# Patient Record
Sex: Male | Born: 1965 | Race: Black or African American | Hispanic: No | Marital: Single | Smoking: Current every day smoker
Health system: Southern US, Community
[De-identification: ages and names within clinical notes are randomized; demographics above are authoritative.]

## PROBLEM LIST (undated history)

## (undated) ENCOUNTER — Emergency Department (HOSPITAL_COMMUNITY): Discharge status: Against Medical Advice | Payer: Medicaid - Out of State

## (undated) DIAGNOSIS — M25569 Pain in unspecified knee: Secondary | ICD-10-CM

## (undated) HISTORY — PX: HIP SURGERY: SHX245

## (undated) HISTORY — PX: FRACTURE SURGERY: SHX138

---

## 1999-09-03 ENCOUNTER — Emergency Department (HOSPITAL_COMMUNITY): Admission: EM | Admit: 1999-09-03 | Discharge: 1999-09-03 | Payer: Self-pay

## 1999-09-03 ENCOUNTER — Encounter: Payer: Self-pay | Admitting: Emergency Medicine

## 1999-11-13 ENCOUNTER — Emergency Department (HOSPITAL_COMMUNITY): Admission: EM | Admit: 1999-11-13 | Discharge: 1999-11-13 | Payer: Self-pay

## 2000-03-26 ENCOUNTER — Emergency Department (HOSPITAL_COMMUNITY): Admission: EM | Admit: 2000-03-26 | Discharge: 2000-03-26 | Payer: Self-pay

## 2006-08-07 ENCOUNTER — Emergency Department (HOSPITAL_COMMUNITY): Admission: EM | Admit: 2006-08-07 | Discharge: 2006-08-07 | Payer: Self-pay | Admitting: Emergency Medicine

## 2006-11-30 ENCOUNTER — Emergency Department (HOSPITAL_COMMUNITY): Admission: EM | Admit: 2006-11-30 | Discharge: 2006-11-30 | Payer: Self-pay | Admitting: Emergency Medicine

## 2013-04-08 ENCOUNTER — Emergency Department (HOSPITAL_COMMUNITY): Payer: Medicaid - Out of State

## 2013-04-08 ENCOUNTER — Encounter (HOSPITAL_COMMUNITY): Payer: Self-pay | Admitting: Emergency Medicine

## 2013-04-08 ENCOUNTER — Emergency Department (HOSPITAL_COMMUNITY)
Admission: EM | Admit: 2013-04-08 | Discharge: 2013-04-08 | Disposition: A | Discharge status: Home / Self Care | Payer: Medicaid - Out of State | Attending: Emergency Medicine | Admitting: Emergency Medicine

## 2013-04-08 DIAGNOSIS — M25531 Pain in right wrist: Secondary | ICD-10-CM

## 2013-04-08 DIAGNOSIS — F17200 Nicotine dependence, unspecified, uncomplicated: Secondary | ICD-10-CM | POA: Insufficient documentation

## 2013-04-08 DIAGNOSIS — M25539 Pain in unspecified wrist: Secondary | ICD-10-CM | POA: Insufficient documentation

## 2013-04-08 MED ORDER — OXYCODONE-ACETAMINOPHEN 5-325 MG PO TABS
2.0000 | ORAL_TABLET | Freq: Once | ORAL | Status: AC
  Administered 2013-04-08: 2 via ORAL
  Filled 2013-04-08: qty 2

## 2013-04-08 MED ORDER — OXYCODONE-ACETAMINOPHEN 5-325 MG PO TABS: 1.0000 | ORAL_TABLET | Freq: Four times a day (QID) | ORAL | Status: DC | PRN

## 2013-04-08 NOTE — ED Notes (Signed)
Pt states that he has been doing yardwork and felt rt wrist pain x 2 days; pt denies injury but states that the pain has progressed and feels unable to lift up leaves or anything due to pain in wrist; Rt wrist mildly swollen and tender to palpation

## 2013-04-08 NOTE — ED Provider Notes (Signed)
CSN: [id number]     Arrival date & time 04/08/13  2119 History  This chart was scribed for non-physician practitioner Junius Finner, PA-C working with Richardean Canal, MD by Caryn Bee, ED Scribe. This patient was seen in room WTR8/WTR8 and the patient's care was started at 10:34 PM.    Chief Complaint  Patient presents with  . Wrist Pain   HPI HPI Comments: Joseph Rubio is a 47 y.o. male who presents to the Emergency Department complaining of constant gradual worsening right wrist pain that began 2 weeks ago, although triage note reports worsening 2 days ago. He denies falling or recent injury to the right wrist but does report using more helping family members around the house and yard. Pain is worsened with movement and when he tries to lift things. He has taken aleve, 800 mg ibuprofen, and hydrocodone with relief. Pt has also been wearing a brace intermittently on his wrist that he obtained from a family member for about one week with no relief. He denies numbness. Pt is right handed. Pt has h/o hip surgery after a fall where he also injured his hand but is a poor historian about how severely he injured same hand during the fall. He saw an orthopaedist in [place] regarding his hand and was told that there was no injury but then stated his hand did not hurt at that time. Pt works in Aeronautical [employer]. Pt does not have PCP.  No hx of gout.   History reviewed. No pertinent past medical history. Past Surgical History  Procedure Laterality Date  . Hip surgery     No family history on file. History  Substance Use Topics  . Smoking status: Current Every Day Smoker -- 0.25 packs/day    Types: Cigarettes  . Smokeless tobacco: Not on file  . Alcohol Use: Yes     Comment: socially    Review of Systems  Musculoskeletal: Arthralgias: Right wrist    Neurological: Negative for numbness.  All other systems reviewed and are negative.    Allergies  Review of patient's allergies indicates no  known allergies.  Home Medications   Current Outpatient Rx  Name  Route  Sig  Dispense  Refill  . ibuprofen (ADVIL,MOTRIN) 100 MG tablet   Oral   Take 800 mg by mouth every 6 (six) hours as needed for fever (pain).         . naproxen (NAPROSYN) 250 MG tablet   Oral   Take 500 mg by mouth 2 (two) times daily as needed (pain).         Marland Kitchen oxyCODONE-acetaminophen (PERCOCET/ROXICET) 5-325 MG per tablet   Oral   Take 1 tablet by mouth every 6 (six) hours as needed for pain.   6 tablet   0    Triage Vitals: BP 145/97  Pulse 95  Temp(Src) 98.3 F (36.8 C) (Oral)  Resp 18  Ht 5\' 7"  (1.702 m)  Wt 165 lb (74.844 kg)  BMI 25.84 kg/m2  SpO2 98%  Physical Exam  Nursing note and vitals reviewed. Constitutional: He is oriented to person, place, and time. He appears well-developed and well-nourished.  HENT:  Head: Normocephalic and atraumatic.  Eyes: EOM are normal.  Neck: Normal range of motion.  Cardiovascular: Normal rate.   Pulmonary/Chest: Effort normal.  Musculoskeletal: He exhibits edema.  Right wrist mild: swelling and stiffness. Limited flexion and extension at the wrist. Tenderness on volar aspect of the wrist. Tenderness along ulnar aspect. No ecchymosis, erythema,  or warmth. 4/5 grip strength. Normal sensation to light touch. Radial pulse 2+.   Neurological: He is alert and oriented to person, place, and time.  Skin: Skin is warm and dry.  Psychiatric: He has a normal mood and affect. His behavior is normal.    ED Course  Procedures (including critical care time) DIAGNOSTIC STUDIES: Oxygen Saturation is 98% on room air, normal by my interpretation.    COORDINATION OF CARE: 10:45 PM-Discussed treatment plan with pt at bedside and pt agreed to plan.   Labs Review Labs Reviewed - No data to display Imaging Review Dg Wrist Complete Right  04/08/2013   CLINICAL DATA:  Pain without trauma I  EXAM: RIGHT WRIST - COMPLETE 3+ VIEW  COMPARISON:  08/07/2006  FINDINGS:  Mild narrowing of the joint space in the radiocarpal compartment. Carpal rows intact. Normal mineralization and alignment. No significant osseous degenerative change. Negative for fracture, dislocation, or other acute bone abnormality.  IMPRESSION: 1. Negative for fracture or other acute bony injury. 2. Early narrowing of the joint space in the radiocarpal compartment.   Electronically Signed   By: Oley Balm M.D.   On: 04/08/2013 23:06    EKG Interpretation   None       MDM   1. Right wrist pain    Pt presenting with right wrist stiffness and pain.  No hx of recent trauma.  Pt does report increased use of hand helping family members around house and yard.  Plain films: negative for fx of acute bony injury, early narrowing of joint space in radiocarpal compartment (possible source of pt's symptoms?)   On exam: mild swelling, no erythema, ecchymosis or warmth. Not concerned for gout or cellulitis.  Will tx as an over use injury.  Advised pt to f/u with Dr. Mina Marble, hand surgery, for continued pain and stiffness. Rx: cock-up wrist splint. Percocet (6 tabs). Also encouraged use of heat and gentle movements throughout the day to help prevent wrist from getting too stiff. Splint should be worn at night and while working in the yard.  Pt verbalized understanding and agreement with tx plan.  I personally performed the services described in this documentation, which was scribed in my presence. The recorded information has been reviewed and is accurate.    Junius Finner, PA-C 04/09/13 [phone]

## 2013-04-09 NOTE — ED Provider Notes (Signed)
Medical screening examination/treatment/procedure(s) were performed by non-physician practitioner and as supervising physician I was immediately available for consultation/collaboration.  EKG Interpretation   None        Atha Mcbain K Sem Mccaughey-Rasch, MD 04/09/13 0313 

## 2014-09-24 ENCOUNTER — Encounter (HOSPITAL_COMMUNITY): Payer: Self-pay | Admitting: *Deleted

## 2014-09-24 ENCOUNTER — Emergency Department (HOSPITAL_COMMUNITY): Payer: Medicaid - Out of State

## 2014-09-24 ENCOUNTER — Emergency Department (HOSPITAL_COMMUNITY)
Admission: EM | Admit: 2014-09-24 | Discharge: 2014-09-24 | Disposition: A | Discharge status: Home / Self Care | Payer: Medicaid - Out of State | Attending: Emergency Medicine | Admitting: Emergency Medicine

## 2014-09-24 DIAGNOSIS — R1032 Left lower quadrant pain: Secondary | ICD-10-CM

## 2014-09-24 DIAGNOSIS — R1030 Lower abdominal pain, unspecified: Secondary | ICD-10-CM | POA: Insufficient documentation

## 2014-09-24 DIAGNOSIS — Z9889 Other specified postprocedural states: Secondary | ICD-10-CM | POA: Insufficient documentation

## 2014-09-24 DIAGNOSIS — M791 Myalgia: Secondary | ICD-10-CM | POA: Insufficient documentation

## 2014-09-24 DIAGNOSIS — Z79899 Other long term (current) drug therapy: Secondary | ICD-10-CM | POA: Insufficient documentation

## 2014-09-24 DIAGNOSIS — M7989 Other specified soft tissue disorders: Secondary | ICD-10-CM | POA: Insufficient documentation

## 2014-09-24 DIAGNOSIS — Z8781 Personal history of (healed) traumatic fracture: Secondary | ICD-10-CM | POA: Insufficient documentation

## 2014-09-24 DIAGNOSIS — Z720 Tobacco use: Secondary | ICD-10-CM | POA: Insufficient documentation

## 2014-09-24 DIAGNOSIS — M25552 Pain in left hip: Secondary | ICD-10-CM | POA: Insufficient documentation

## 2014-09-24 LAB — D-DIMER, QUANTITATIVE: D-Dimer, Quant: <0.27 ug/mL-FEU (ref 0.00–0.48)

## 2014-09-24 MED ORDER — IBUPROFEN 600 MG PO TABS: 600.0000 mg | ORAL_TABLET | Freq: Four times a day (QID) | ORAL | Status: DC | PRN

## 2014-09-24 MED ORDER — OXYCODONE-ACETAMINOPHEN 5-325 MG PO TABS: 1.0000 | ORAL_TABLET | Freq: Four times a day (QID) | ORAL | Status: DC | PRN

## 2014-09-24 NOTE — ED Notes (Signed)
Pt reports left groin pain described as sharp - radiates down left anterior thigh, worse w/ ambulation x2 weeks. Pt denies any n/v/d, fever, or testicular pain or swelling.

## 2014-09-24 NOTE — ED Notes (Signed)
Pt reports L upper anterior thigh pain which is intermittent x 1 week.  Pt reports pain is worse with movement.  Unable to sleep tonight d/t pain.  Pt denies any injury at this time.

## 2014-09-24 NOTE — Discharge Instructions (Signed)
Groin Strain °A groin strain (also called a groin pull) is an injury to the muscles or tendon on the upper inner part of the thigh. These muscles are called the adductor muscles or groin muscles. They are responsible for moving the leg across the body. A muscle strain occurs when a muscle is overstretched and some muscle fibers are torn. A groin strain can range from mild to severe depending on how many muscle fibers are affected and whether the muscle fibers are partially or completely torn.  °Groin strains usually occur during exercise or participation in sports. The injury often happens when a sudden, violent force is placed on a muscle, stretching the muscle too far. A strain is more likely to occur when your muscles are not warmed up or if you are not properly conditioned. Depending on the severity of the groin strain, recovery time may vary from a few weeks to several weeks. Severe injuries often require 4-6 weeks for recovery. In these cases, complete healing can take 4-5 months.  °CAUSES  °· Stretching the groin muscles too far or too suddenly, often during side-to-side motion with an abrupt change in direction. °· Putting repeated stress on the groin muscles over a long period of time. °· Performing vigorous activity without properly stretching the groin muscles beforehand. °SYMPTOMS  °· Pain and tenderness in the groin area. This begins as sharp pain and persists as a dull ache. °· Popping or snapping feeling when the injury occurs (for severe strains). °· Swelling or bruising. °· Muscle spasms. °· Weakness in the leg. °· Stiffness in the groin area with decreased ability to move the affected muscles. °DIAGNOSIS  °Your caregiver will perform a physical exam to diagnose a groin strain. You will be asked about your symptoms and how the injury occurred. X-rays are sometimes needed to rule out a broken bone or cartilage problems. Your caregiver may order a CT scan or MRI if a complete muscle tear is  suspected. °TREATMENT  °A groin strain will often heal on its own. Your caregiver may prescribe medicines to help manage pain and swelling (anti-inflammatory medicine). You may be told to use crutches for the first few days to minimize your pain. °HOME CARE INSTRUCTIONS  °· Rest. Do not use the strained muscle if it causes pain. °· Put ice on the injured area. °¨ Put ice in a plastic bag. °¨ Place a towel between your skin and the bag. °¨ Leave the ice on for 15-20 minutes, every 2-3 hours. Do this for the first 2 days after the injury.  °· Only take over-the-counter or prescription medicines as directed by your caregiver. °· Wrap the injured area with an elastic bandage as directed by your caregiver. °· Keep the injured leg raised (elevated). °· Walk, stretch, and perform range-of-motion exercises to improve blood flow to the injured area. Only perform these activities if you can do so without any pain. °To prevent muscle strains: °· Warm up before exercise. °· Develop proper conditioning and strength in the groin muscles. °SEEK IMMEDIATE MEDICAL CARE IF:  °· You have increased pain or swelling in the affected area.   °· Your symptoms are not improving or are getting worse. °MAKE SURE YOU:  °· Understand these instructions. °· Will watch your condition. °· Will get help right away if you are not doing well or get worse. °Document Released: 01/24/2004 Document Revised: 05/14/2012 Document Reviewed: 01/30/2012 °[employer]® Patient Information ©2015 [employer]. This information is not intended to replace advice given to you   by your health care provider. Make sure you discuss any questions you have with your health care provider. ° °

## 2014-09-24 NOTE — ED Notes (Signed)
Pt ambulating independently w/ steady gait on d/c in no acute distress, A&Ox4. D/c instructions reviewed w/ pt and family - pt and family deny any further questions or concerns at present. Rx given x2  

## 2014-09-24 NOTE — ED Provider Notes (Signed)
CSN: [id number]     Arrival date & time 09/24/14  0006 History   First MD Initiated Contact with Patient 09/24/14 0023     Chief Complaint  Patient presents with  . Leg Pain    (Consider location/radiation/quality/duration/timing/severity/associated sxs/prior Treatment) HPI Comments: Patient is a 49 year old male with a history of right hip fracture surgery who presents to the emergency department for further evaluation of left groin pain. Patient states that his pain began a few weeks ago and has been worsening and constant. He describes the pain as sharp, aggravated with walking. He states that he has had a burning sensation down his left lateral leg and that the pain intermittently travels to his foot. He has noticed some mild left foot swelling. He took Aleve for symptoms without relief. Patient denies associated fever, nausea, vomiting, abdominal pain, scrotal swelling, testicular tenderness, or bowel/bladder incontinence. He has had some mild low back pain, but cannot specify if this is associated with the pain in his left leg. No recent surgeries, hospitalizations, trauma, or injury.  The history is provided by the patient. No language interpreter was used.    History reviewed. No pertinent past medical history. Past Surgical History  Procedure Laterality Date  . Hip surgery    . Fracture surgery      R hip   No family history on file. History  Substance Use Topics  . Smoking status: Current Every Day Smoker -- 0.25 packs/day    Types: Cigarettes  . Smokeless tobacco: Not on file  . Alcohol Use: Yes     Comment: socially    Review of Systems  Constitutional: Negative for fever.  Respiratory: Negative for shortness of breath.   Cardiovascular: Positive for leg swelling.  Genitourinary:       Negative for incontinence  Musculoskeletal: Positive for myalgias and back pain.  Neurological: Negative for syncope and weakness.  All other systems reviewed and are  negative.   Allergies  Review of patient's allergies indicates no known allergies.  Home Medications   Prior to Admission medications   Medication Sig Start Date End Date Taking? Authorizing Provider  naproxen (NAPROSYN) 250 MG tablet Take 500 mg by mouth 2 (two) times daily as needed (pain).   Yes Historical Provider, MD  omega-3 acid ethyl esters (LOVAZA) 1 G capsule Take 1 g by mouth 2 (two) times a week.   Yes Historical Provider, MD  oxyCODONE-acetaminophen (PERCOCET/ROXICET) 5-325 MG per tablet Take 1 tablet by mouth every 6 (six) hours as needed for pain. Patient not taking: Reported on 09/24/2014 04/08/13   Junius Finner, PA-C   BP 179/103 mmHg  Temp(Src) 97.6 F (36.4 C) (Oral)  Resp 18  Ht  (1.702 m)  Wt 163 lb (73.936 kg)  BMI 25.52 kg/m2  SpO2 99%   Physical Exam  Constitutional: He is oriented to person, place, and time. He appears well-developed and well-nourished. No distress.  Nontoxic/nonseptic appearing  HENT:  Head: Normocephalic and atraumatic.  Eyes: Conjunctivae and EOM are normal. No scleral icterus.  Neck: Normal range of motion.  Cardiovascular: Normal rate, regular rhythm and intact distal pulses.   Pulmonary/Chest: Effort normal. No respiratory distress.  Respirations even and unlabored  Abdominal: Hernia confirmed negative in the right inguinal area and confirmed negative in the left inguinal area.  Genitourinary: Testes normal and penis normal. Right testis shows no mass, no swelling and no tenderness. Right testis is descended. Left testis shows no mass, no swelling and no tenderness. Left  testis is descended. Circumcised. No penile tenderness.  Unremarkable GU exam, chaperoned. No evidence of direct or indirect inguinal hernia.  Musculoskeletal: Normal range of motion. He exhibits tenderness. He exhibits no edema.       Left hip: He exhibits tenderness. He exhibits normal range of motion, normal strength, no bony tenderness, no swelling, no  crepitus and no deformity.       Legs: No pitting edema in bilateral lower extremities  Neurological: He is alert and oriented to person, place, and time. He exhibits normal muscle tone. Coordination normal.  Sensation to light touch intact in all extremities. Patient ambulatory with steady gait. GCS 15.  Skin: Skin is warm and dry. No rash noted. He is not diaphoretic. No erythema. No pallor.  Psychiatric: He has a normal mood and affect. His behavior is normal.  Nursing note and vitals reviewed.   ED Course  Procedures (including critical care time) Labs Review Labs Reviewed  D-DIMER, QUANTITATIVE    Imaging Review Dg Lumbar Spine Complete  09/24/2014   CLINICAL DATA:  Left groin pain radiating down left thigh. Worse with ambulation. Symptoms for 2 weeks. No known injury.  EXAM: LUMBAR SPINE - COMPLETE 4+ VIEW  COMPARISON:  None.  FINDINGS: The alignment is maintained. Vertebral body heights are normal. There is no listhesis. The posterior elements are intact. Minimal diffuse endplate spurring, the disc spaces are preserved. No fracture. Sacroiliac joints are symmetric with mild degenerative change.  IMPRESSION: Mild spondylosis, no acute bony abnormality.   Electronically Signed   By: Rubye OaksMelanie  Ehinger M.D.   On: 09/24/2014 02:45   Dg Hip Unilat With Pelvis 2-3 Views Left  09/24/2014   CLINICAL DATA:  Left groin pain which is sharp and radiating  EXAM: LEFT HIP (WITH PELVIS) 2-3 VIEWS  COMPARISON:  None.  FINDINGS: Minimal spurring about the left hip with os acetabulum. There is no clear joint narrowing. No fracture, erosion, or evidence of osteonecrosis.  Remote proximal right femur fracture with ORIF. No acute findings to the visible hardware.  The bony pelvis is intact.  IMPRESSION: No acute finding or significant left hip degeneration.   Electronically Signed   By: Marnee SpringJonathon  Watts M.D.   On: 09/24/2014 02:45     EKG Interpretation None      MDM   Final diagnoses:  Left groin  pain    49 year old male presents to the emergency department for further evaluation of left groin pain. Symptoms have been ongoing over the past week. Patient is neurovascularly intact. He is afebrile and hemodynamically stable. D-dimer negative and patient with no evidence of lower extremity edema. Doubt DVT. X-ray of low back and hip are also unremarkable. No evidence of acute fracture or bony deformity. Chaperoned genital exam negative for evidence of inguinal hernia. No red flags or signs concerning for cauda equina.  Suspect that symptoms are secondary to an uncomplicated groin strain. Will manage symptoms as outpatient with Percocet and ibuprofen. Have advised primary care follow-up for further evaluation of symptoms and provided return precautions. Patient ambulated out of the ED in good condition. No unaddressed concerns expressed prior to discharge.   Antony MaduraKelly Braydn Carneiro, PA-C 09/27/14 [id number]  Marisa Severinlga Otter, MD 09/28/14 [phone]

## 2015-03-02 ENCOUNTER — Emergency Department (HOSPITAL_COMMUNITY): Payer: Medicaid - Out of State

## 2015-03-02 ENCOUNTER — Emergency Department (HOSPITAL_COMMUNITY)
Admission: EM | Admit: 2015-03-02 | Discharge: 2015-03-03 | Disposition: A | Discharge status: Home / Self Care | Payer: Medicaid - Out of State | Attending: Emergency Medicine | Admitting: Emergency Medicine

## 2015-03-02 ENCOUNTER — Encounter (HOSPITAL_COMMUNITY): Payer: Self-pay | Admitting: Emergency Medicine

## 2015-03-02 DIAGNOSIS — K029 Dental caries, unspecified: Secondary | ICD-10-CM | POA: Insufficient documentation

## 2015-03-02 DIAGNOSIS — L03211 Cellulitis of face: Secondary | ICD-10-CM

## 2015-03-02 DIAGNOSIS — Z720 Tobacco use: Secondary | ICD-10-CM | POA: Insufficient documentation

## 2015-03-02 DIAGNOSIS — K002 Abnormalities of size and form of teeth: Secondary | ICD-10-CM | POA: Insufficient documentation

## 2015-03-02 MED ORDER — IOHEXOL 300 MG/ML  SOLN
75.0000 mL | Freq: Once | INTRAMUSCULAR | Status: AC | PRN
  Administered 2015-03-02: 75 mL via INTRAVENOUS

## 2015-03-02 MED ORDER — KETOROLAC TROMETHAMINE 30 MG/ML IJ SOLN
30.0000 mg | Freq: Once | INTRAMUSCULAR | Status: AC
  Administered 2015-03-02: 30 mg via INTRAVENOUS
  Filled 2015-03-02: qty 1
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
## 2015-03-03 MED ORDER — NAPROXEN 500 MG PO TABS: 500.0000 mg | ORAL_TABLET | Freq: Two times a day (BID) | ORAL | Status: DC

## 2015-03-03 MED ORDER — CLINDAMYCIN HCL 150 MG PO CAPS: 450.0000 mg | ORAL_CAPSULE | Freq: Three times a day (TID) | ORAL | Status: DC

## 2015-03-03 MED ORDER — CLINDAMYCIN HCL 300 MG PO CAPS
450.0000 mg | ORAL_CAPSULE | Freq: Three times a day (TID) | ORAL | Status: AC
  Administered 2015-03-03: 450 mg via ORAL
  Filled 2015-03-03: qty 1

## 2015-03-03 MED ORDER — AMOXICILLIN 500 MG PO CAPS: 500.0000 mg | ORAL_CAPSULE | Freq: Once | ORAL | Status: DC

## 2015-03-03 MED ORDER — HYDROCODONE-ACETAMINOPHEN 5-325 MG PO TABS: 1.0000 | ORAL_TABLET | Freq: Four times a day (QID) | ORAL | Status: DC | PRN

## 2015-03-03 NOTE — Discharge Instructions (Signed)

## 2015-07-29 ENCOUNTER — Encounter (HOSPITAL_COMMUNITY): Payer: Self-pay | Admitting: Emergency Medicine

## 2015-07-29 ENCOUNTER — Emergency Department (HOSPITAL_COMMUNITY)
Admission: EM | Admit: 2015-07-29 | Discharge: 2015-07-29 | Disposition: A | Discharge status: Home / Self Care | Payer: Medicaid - Out of State | Attending: Emergency Medicine | Admitting: Emergency Medicine

## 2015-07-29 DIAGNOSIS — M25462 Effusion, left knee: Secondary | ICD-10-CM | POA: Insufficient documentation

## 2015-07-29 DIAGNOSIS — F17210 Nicotine dependence, cigarettes, uncomplicated: Secondary | ICD-10-CM | POA: Insufficient documentation

## 2015-07-29 DIAGNOSIS — M25562 Pain in left knee: Secondary | ICD-10-CM | POA: Insufficient documentation

## 2015-07-29 MED ORDER — KETOROLAC TROMETHAMINE 30 MG/ML IJ SOLN
30.0000 mg | Freq: Once | INTRAMUSCULAR | Status: AC
  Administered 2015-07-29: 30 mg via INTRAMUSCULAR
  Filled 2015-07-29: qty 1

## 2015-07-29 MED ORDER — HYDROCODONE-ACETAMINOPHEN 5-325 MG PO TABS: 1.0000 | ORAL_TABLET | Freq: Four times a day (QID) | ORAL | Status: DC | PRN

## 2015-07-29 MED ORDER — IBUPROFEN 600 MG PO TABS: 600.0000 mg | ORAL_TABLET | Freq: Three times a day (TID) | ORAL | Status: AC

## 2015-07-29 NOTE — ED Provider Notes (Signed)
CSN: [id number]     Arrival date & time 07/29/15  0910 History   First MD Initiated Contact with Patient 07/29/15 0932     Chief Complaint  Patient presents with  . Knee Pain     (Consider location/radiation/quality/duration/timing/severity/associated sxs/prior Treatment) HPI Patient presents with concern of new left knee pain. Onset was atraumatic, without clear precipitant. Now, 3 days the patient has had diffuse swelling, pain throughout the left knee. Patient denies distal dysesthesia, weakness, or proximal pain. Since onset pain has been minimally improved with OTC medication, and elevation. Patient denies history of gout, surgical intervention on the left lower extremity. Patient states that he is otherwise well, without complaint.   History reviewed. No pertinent past medical history. Past Surgical History  Procedure Laterality Date  . Hip surgery    . Fracture surgery      R hip   No family history on file. Social History  Substance Use Topics  . Smoking status: Current Every Day Smoker -- 0.25 packs/day    Types: Cigarettes  . Smokeless tobacco: None  . Alcohol Use: Yes     Comment: socially    Review of Systems  Constitutional: Negative for fever.  Respiratory: Negative for shortness of breath.   Cardiovascular: Negative for chest pain.  Musculoskeletal:       Negative aside from HPI  Skin:       Negative aside from HPI  Allergic/Immunologic: Negative for immunocompromised state.  Neurological: Negative for weakness.      Allergies  Review of patient's allergies indicates no known allergies.  Home Medications   Prior to Admission medications   Medication Sig Start Date End Date Taking? Authorizing Provider  HYDROcodone-acetaminophen (NORCO/VICODIN) 5-325 MG tablet Take 1-2 tablets by mouth every 6 (six) hours as needed for severe pain. 07/29/15   Gerhard Munch, MD  ibuprofen (ADVIL,MOTRIN) 600 MG tablet Take 1 tablet (600 mg total) by mouth 3  (three) times daily. 07/29/15 08/01/15  Gerhard Munch, MD   BP 188/114 mmHg  Pulse 97  Temp(Src) 98.2 F (36.8 C) (Oral)  Resp 15  SpO2 94% Physical Exam  Constitutional: He is oriented to person, place, and time. He appears well-developed. No distress.  HENT:  Head: Normocephalic and atraumatic.  Eyes: Conjunctivae and EOM are normal.  Cardiovascular: Normal rate and regular rhythm.   Pulmonary/Chest: Effort normal. No stridor. No respiratory distress.  Abdominal: He exhibits no distension.  Musculoskeletal: He exhibits no edema.       Left hip: Normal.       Left ankle: Normal.       Legs: Neurological: He is alert and oriented to person, place, and time.  Skin: Skin is warm and dry.  Psychiatric: He has a normal mood and affect.  Nursing note and vitals reviewed.   ED Course  Procedures (including critical care time)  On completion of the physical exam I described the indication for arthrocentesis to exclude possibility of septic joint, and for evaluation of possibilities such as gout. Patient deferred this recommendation, states that he would like to try pain management, cryotherapy, close follow-up. We discussed explicit return precautions, and the patient was discharged.  MDM   Final diagnoses:  Knee pain, acute, left  Well-appearing male presents with 3 days of left knee swelling, pain. Patient has no history of immunocompromise, nor   gout. No surgical history either. Patient's pain, effusion, likely musculoskeletal, though he was unwilling to have arthrocentesis to completely exclude septic joint. Advised of return  precautions, follow-up instructions, discharged in stable condition with analgesia.  Gerhard Munch, MD 07/29/15 1024

## 2015-07-29 NOTE — Discharge Instructions (Signed)
As discussed, although you have elected to not have fluid taken from your knee today, it is still important that you monitor your condition carefully, and do not hesitate to return if you develop new, or concerning changes.  Otherwise, please be sure to follow-up with our orthopedic specialist.  In the next few days, please use ice packs, in addition to the prescribed medication for relief.

## 2015-07-29 NOTE — ED Notes (Signed)
Pt from home c/o L knee pain x3 days. Pt sts that he woke up with swelling and pain. Pt denies trauma, fall or previous sx. Pt L knee is swollen, red and hot to touch. Pt sts the he took Aleve with no relief. Pt is A&O and in NAD

## 2015-12-24 ENCOUNTER — Encounter (HOSPITAL_COMMUNITY): Payer: Self-pay | Admitting: Emergency Medicine

## 2015-12-24 ENCOUNTER — Emergency Department (HOSPITAL_COMMUNITY)
Admission: EM | Admit: 2015-12-24 | Discharge: 2015-12-24 | Disposition: A | Discharge status: Home / Self Care | Payer: Medicaid - Out of State | Attending: Emergency Medicine | Admitting: Emergency Medicine

## 2015-12-24 DIAGNOSIS — F17210 Nicotine dependence, cigarettes, uncomplicated: Secondary | ICD-10-CM | POA: Insufficient documentation

## 2015-12-24 DIAGNOSIS — M542 Cervicalgia: Secondary | ICD-10-CM

## 2015-12-24 MED ORDER — CYCLOBENZAPRINE HCL 10 MG PO TABS: 10.0000 mg | ORAL_TABLET | Freq: Three times a day (TID) | ORAL | Status: DC | PRN

## 2015-12-24 NOTE — Discharge Instructions (Signed)

## 2015-12-24 NOTE — ED Notes (Signed)
Pt c/o L side neck, shoulder, and arm pain x 1 day.  Pt reports pain increases w/ movement.  Limited ROM noted.  Pt reports intermittent shooting pain from neck into posterior head.

## 2015-12-24 NOTE — ED Notes (Signed)
Pt reports onset left neck pain with movement onset yesterday morning; denies injury.

## 2015-12-24 NOTE — ED Provider Notes (Signed)
CSN: [id number]     Arrival date & time 12/24/15  0827 History   First MD Initiated Contact with Patient 12/24/15 [phone]     Chief Complaint  Patient presents with  . Neck Pain      HPI   50 year old male presents today with complaints of neck and shoulder pain. Patient reports symptoms started 1 day ago with pains to the lateral aspect and posterior aspect of his neck and anterior shoulder. Patient denies any trauma . Any heavy lifting. He reports the pain is worse with range of motion of his neck and his shoulder causing radiation of pain down to his arm. He denies any loss of sensation strength or motor function other than difficulty moving it due to pain. Patient denies any fever or swelling, rash, redness. He denies any headache, dizziness, lightheadedness, Chest pain or shortness of breath.  History reviewed. No pertinent past medical history. Past Surgical History  Procedure Laterality Date  . Hip surgery    . Fracture surgery      R hip   No family history on file. Social History  Substance Use Topics  . Smoking status: Current Some Day Smoker    Types: Cigarettes  . Smokeless tobacco: None  . Alcohol Use: Yes     Comment: socially    Review of Systems  All other systems reviewed and are negative.     Allergies  Review of patient's allergies indicates no known allergies.  Home Medications   Prior to Admission medications   Medication Sig Start Date End Date Taking? Authorizing Provider  cyclobenzaprine (FLEXERIL) 10 MG tablet Take 1 tablet (10 mg total) by mouth 3 (three) times daily as needed for muscle spasms. 12/24/15   Sherman Donaldson, PA-C   BP 192/107 mmHg  Pulse 64  Temp(Src) 97.6 F (36.4 C) (Oral)  Resp 16  Ht 5\' 7"  (1.702 m)  Wt 73.936 kg  BMI 25.52 kg/m2  SpO2 99% Physical Exam  Constitutional: He is oriented to person, place, and time. He appears well-developed and well-nourished.  HENT:  Head: Normocephalic and atraumatic.  Eyes: Conjunctivae  are normal. Pupils are equal, round, and reactive to light. Right eye exhibits no discharge. Left eye exhibits no discharge. No scleral icterus.  Neck: Normal range of motion. No JVD present. No tracheal deviation present.  Pulmonary/Chest: Effort normal. No stridor.  Musculoskeletal:  Tenderness to palpation of the lateral musculature of the neck, trapezius, and anterior shoulder. No redness, warmth to touch, swelling or edema noted. Strength 5 out of 5, radial pulses 2+, Refill less than 3 seconds.  Neurological: He is alert and oriented to person, place, and time. Coordination normal.  Psychiatric: He has a normal mood and affect. His behavior is normal. Judgment and thought content normal.  Nursing note and vitals reviewed.   ED Course  Procedures (including critical care time) Labs Review Labs Reviewed - No data to display  Imaging Review No results found. I have personally reviewed and evaluated these images and lab results as part of my medical decision-making.   EKG Interpretation None      MDM   Final diagnoses:  Neck pain    Labs:  Imaging:  Consults:  Therapeutics:  Discharge Meds:   Assessment/Plan: 50 year old male presents today with likely musculoskeletal pain. Patient is tender to palpation, likely muscular strain. He has no signs of infectious etiology, no neuro deficits, no other signs or symptoms that indicate referred pain. He will be given symptomatic care instructions,  follow-up information. No questions or concerns at time of discharge, strict return precautions given.        Eyvonne Mechanic, PA-C 12/25/15 1624  Benjiman Core, MD 12/27/15 1729

## 2015-12-24 NOTE — ED Notes (Addendum)
Verbalized understanding discharge instructions, prescriptions, and follow-up. In no acute distress.  Pt directed to take ibuprofen for pain.  Pt given a warm pack, ice pack, and bus pass.  Pt directed to follow up w/ PCP regarding HTN.

## 2016-02-27 ENCOUNTER — Emergency Department (HOSPITAL_COMMUNITY): Payer: Self-pay

## 2016-02-27 ENCOUNTER — Emergency Department (HOSPITAL_COMMUNITY)
Admission: EM | Admit: 2016-02-27 | Discharge: 2016-02-28 | Disposition: A | Discharge status: Home / Self Care | Payer: Self-pay | Attending: Emergency Medicine | Admitting: Emergency Medicine

## 2016-02-27 ENCOUNTER — Encounter (HOSPITAL_COMMUNITY): Payer: Self-pay

## 2016-02-27 DIAGNOSIS — M2550 Pain in unspecified joint: Secondary | ICD-10-CM

## 2016-02-27 DIAGNOSIS — F17210 Nicotine dependence, cigarettes, uncomplicated: Secondary | ICD-10-CM | POA: Insufficient documentation

## 2016-02-27 DIAGNOSIS — R700 Elevated erythrocyte sedimentation rate: Secondary | ICD-10-CM

## 2016-02-27 DIAGNOSIS — M2540 Effusion, unspecified joint: Secondary | ICD-10-CM

## 2016-02-27 MED ORDER — KETOROLAC TROMETHAMINE 60 MG/2ML IM SOLN
60.0000 mg | Freq: Once | INTRAMUSCULAR | Status: AC
  Administered 2016-02-28: 60 mg via INTRAMUSCULAR
  Filled 2016-02-27: qty 2

## 2016-02-27 NOTE — ED Triage Notes (Signed)
Pt complains of right hip, leg and foot pain since Wednesday, he now complains of left hip pain and knee pain

## 2016-02-27 NOTE — ED Provider Notes (Signed)
[facility]-EMERGENCY DEPT Provider Note   CSN: [id number] Arrival date & time: 02/27/16  1859  By signing my name below, I, Vista Minkobert Ross, attest that this documentation has been prepared under the direction and in the presence of Cheikh Bramble PA-C.  Electronically Signed: Vista Minkobert Ross, ED Scribe. 02/27/16. 11:51 PM.   History   Chief Complaint Chief Complaint  Patient presents with  . Hip Pain    HPI HPI Comments: Joseph Rubio is a 50 y.o. male who presents to the Emergency Department complaining of gradually worsening bilateral extremity pain and swelling onset six days ago. Pt states that the pain and swelling started at his ankles and has now started to swell at his knees. Pt complains of worst pain to the right side of his hip and reports difficulty ambulating due to exacerbating pain, left hip hurts as well. Pt brought his grandmothers walkers with him to the ED to aid in ambulation. He also complains of pain to his right wrist that has been intermittent for the past year. He has no Hx of similar pain and swelling. He denies Hx of family Hx of RA.   The history is provided by the patient. No language interpreter was used.    History reviewed. No pertinent past medical history.  There are no active problems to display for this patient.   Past Surgical History:  Procedure Laterality Date  . FRACTURE SURGERY     R hip  . HIP SURGERY         Home Medications    Prior to Admission medications   Medication Sig Start Date End Date Taking? Authorizing Provider  oxyCODONE-acetaminophen (PERCOCET/ROXICET) 5-325 MG tablet Take 1 tablet by mouth every 6 (six) hours as needed for severe pain. 02/28/16   Jacara Benito Neva SeatGreene, PA-C  predniSONE (DELTASONE) 10 MG tablet Take 2 tablets (20 mg total) by mouth daily. 02/28/16   Marlon Peliffany Kaylamarie Swickard, PA-C    Family History History reviewed. No pertinent family history.  Social History Social History  Substance Use Topics  . Smoking status:  Current Some Day Smoker    Types: Cigarettes  . Smokeless tobacco: Never Used  . Alcohol use Yes     Comment: socially     Allergies   Review of patient's allergies indicates no known allergies.   Review of Systems Review of Systems  Constitutional: Negative for fever.  Musculoskeletal: Positive for arthralgias (right lower extremity, right hip, right wrist) and joint swelling (right knee and ankle).  All other systems reviewed and are negative.    Physical Exam Updated Vital Signs BP (!) 162/104   Pulse 78   Temp 97.7 F (36.5 C) (Oral)   Resp 17   Ht 5\' 7"  (1.702 m)   Wt 73 kg   SpO2 99%   BMI 25.22 kg/m   Physical Exam  Musculoskeletal:  Bilateral knee effusions. Soft fluid, no induration or erythema, mild tenderness to palpation. No obvious swelling of the hip or ankle joints but he does have pain to palpation. No rashes present.     ED Treatments / Results  DIAGNOSTIC STUDIES: Oxygen Saturation is 100% on RA, normal by my interpretation.  COORDINATION OF CARE: 11:46 PM-Will order medication for pain and imaging of lower extremities. Discussed treatment plan with pt at bedside and pt agreed to plan.   Labs (all labs ordered are listed, but only abnormal results are displayed) Labs Reviewed  SEDIMENTATION RATE - Abnormal; Notable for the following:  Result Value   Sed Rate 71 (*)    All other components within normal limits  CBC WITH DIFFERENTIAL/PLATELET - Abnormal; Notable for the following:    RBC 3.40 (*)    Hemoglobin 10.1 (*)    HCT 31.4 (*)    All other components within normal limits  COMPREHENSIVE METABOLIC PANEL - Abnormal; Notable for the following:    Potassium 3.4 (*)    ALT 13 (*)    All other components within normal limits  C-REACTIVE PROTEIN    EKG  EKG Interpretation None       Radiology Dg Knee Complete 4 Views Left  Result Date: 02/28/2016 CLINICAL DATA:  Bilateral hip and knee pain x 5 days with no injury; pt  states that he had right hip surgery due to fx 3 years ago and he has been having pain in his right hip and knee ever since then EXAM: LEFT KNEE - COMPLETE 4+ VIEW COMPARISON:  None. FINDINGS: Moderate size left knee effusion. No evidence of acute fracture or dislocation. No focal bone lesion or bone destruction. Bone cortex appears intact. IMPRESSION: Moderate left knee effusion.  No acute fractures demonstrated. Electronically Signed   By: Burman Nieves M.D.   On: 02/28/2016 00:19   Dg Knee Complete 4 Views Right  Result Date: 02/28/2016 CLINICAL DATA:  Bilateral hip and knee pain for 5 days. No injury. Pain in the right hip for 3 years after fracture and surgery. Pain in the left hip and left knee started 5 days ago with no known cause. EXAM: RIGHT KNEE - COMPLETE 4+ VIEW COMPARISON:  None. FINDINGS: Large right knee effusion. No evidence of acute fracture or dislocation. No focal bone lesion or bone destruction. Bone cortex appears intact. IMPRESSION: Large right knee effusion.  No acute fractures identified. Electronically Signed   By: Burman Nieves M.D.   On: 02/28/2016 00:12   Dg Hips Bilat W Or Wo Pelvis 3-4 Views  Result Date: 02/28/2016 CLINICAL DATA:  Bilateral hip and knee pain x 5 days with no injury; pt states that he had right hip surgery due to fx 3 years ago and he has been having pain in his right hip and knee ever since then EXAM: DG HIP (WITH OR WITHOUT PELVIS) 3-4V BILAT COMPARISON:  None. FINDINGS: Pelvis and left hip appear intact. No evidence of acute fracture or dislocation. No focal bone lesion or bone destruction. Right hip demonstrates postoperative changes with plate and screw and compression bolt fixation of an old fracture deformity in the intertrochanteric region. Screw and cerclage wires are also present. There is a fracture of the cerclage wire. No acute fracture or dislocation is noted in the right hip. Mild degenerative changes are present in the right hip. SI joints  and symphysis pubis are not displaced. Calcified phleboliths in the pelvis. IMPRESSION: Old postoperative and posttraumatic changes in the right hip. Mild degenerative changes in the right hip. No evidence of acute fracture or dislocation in either pelvis, right hip, or left hip. Electronically Signed   By: Burman Nieves M.D.   On: 02/28/2016 00:20    Procedures Procedures (including critical care time)  Medications Ordered in ED Medications  ketorolac (TORADOL) injection 60 mg (60 mg Intramuscular Given 02/28/16 0009)  predniSONE (DELTASONE) tablet 60 mg (60 mg Oral Given 02/28/16 0231)     Initial Impression / Assessment and Plan / ED Course  I have reviewed the triage vital signs and the nursing notes.  Pertinent labs &  imaging results that were available during my care of the patient were reviewed by me and considered in my medical decision making (see chart for details).  Clinical Course    Pt has Only arthralgias and joint effusions. He does have an elevated sedimentation rate. He is afebrile and does not appear toxic or sick. He does not clinically present consistent with septic joint or infected joints. I'm concerned for potential autoimmune disease and will be referring him to a rheumatoid doctor. We'll start him on 20 mg prednisone for 2 weeks and pain medication while waiting to get into see the rheumatologist.  I discussed results, diagnoses and plan with Joseph Grills. They voice there understanding and questions were answered. We discussed follow-up recommendations and return precautions.   Final Clinical Impressions(s) / ED Diagnoses   Final diagnoses:  Elevated sed rate  Polyarthralgia  Swelling of multiple joints    New Prescriptions New Prescriptions   OXYCODONE-ACETAMINOPHEN (PERCOCET/ROXICET) 5-325 MG TABLET    Take 1 tablet by mouth every 6 (six) hours as needed for severe pain.   PREDNISONE (DELTASONE) 10 MG TABLET    Take 2 tablets (20 mg total) by mouth  daily.    I personally performed the services described in this documentation, which was scribed in my presence. The recorded information has been reviewed and is accurate.    Marlon Pel, PA-C 02/28/16 1610    Geoffery Lyons, MD 02/28/16 [phone]

## 2016-02-28 LAB — CBC WITH DIFFERENTIAL/PLATELET
BASOS ABS: 0 10*3/uL (ref 0.0–0.1)
Basophils Relative: 1 %
EOS PCT: 3 %
Eosinophils Absolute: 0.3 10*3/uL (ref 0.0–0.7)
HCT: 31.4 % — ABNORMAL LOW (ref 39.0–52.0)
HEMOGLOBIN: 10.1 g/dL — AB (ref 13.0–17.0)
LYMPHS PCT: 28 %
Lymphs Abs: 2.2 10*3/uL (ref 0.7–4.0)
MCH: 29.7 pg (ref 26.0–34.0)
MCHC: 32.2 g/dL (ref 30.0–36.0)
MCV: 92.4 fL (ref 78.0–100.0)
Monocytes Absolute: 0.8 10*3/uL (ref 0.1–1.0)
Monocytes Relative: 11 %
NEUTROS PCT: 57 %
Neutro Abs: 4.5 10*3/uL (ref 1.7–7.7)
PLATELETS: 366 10*3/uL (ref 150–400)
RBC: 3.4 MIL/uL — AB (ref 4.22–5.81)
RDW: 13.9 % (ref 11.5–15.5)
WBC: 7.8 10*3/uL (ref 4.0–10.5)

## 2016-02-28 LAB — COMPREHENSIVE METABOLIC PANEL
ALK PHOS: 57 U/L (ref 38–126)
ALT: 13 U/L — ABNORMAL LOW (ref 17–63)
AST: 19 U/L (ref 15–41)
Albumin: 3.5 g/dL (ref 3.5–5.0)
Anion gap: 7 (ref 5–15)
BUN: 10 mg/dL (ref 6–20)
CHLORIDE: 107 mmol/L (ref 101–111)
CO2: 22 mmol/L (ref 22–32)
Calcium: 8.9 mg/dL (ref 8.9–10.3)
Creatinine, Ser: 0.99 mg/dL (ref 0.61–1.24)
GFR calc Af Amer: >60 mL/min (ref >60)
Glucose, Bld: 85 mg/dL (ref 65–99)
Potassium: 3.4 mmol/L — ABNORMAL LOW (ref 3.5–5.1)
Sodium: 136 mmol/L (ref 135–145)
TOTAL PROTEIN: 7.7 g/dL (ref 6.5–8.1)
Total Bilirubin: 0.5 mg/dL (ref 0.3–1.2)

## 2016-02-28 LAB — SEDIMENTATION RATE: Sed Rate: 71 mm/hr — ABNORMAL HIGH (ref 0–16)

## 2016-02-28 LAB — C-REACTIVE PROTEIN: CRP: 7.5 mg/dL — AB (ref <1.0)

## 2016-02-28 MED ORDER — PREDNISONE 20 MG PO TABS
60.0000 mg | ORAL_TABLET | Freq: Once | ORAL | Status: AC
  Administered 2016-02-28: 60 mg via ORAL
  Filled 2016-02-28: qty 3

## 2016-02-28 MED ORDER — OXYCODONE-ACETAMINOPHEN 5-325 MG PO TABS: 1.0000 | ORAL_TABLET | Freq: Four times a day (QID) | ORAL | 0 refills | Status: AC | PRN

## 2016-02-28 MED ORDER — PREDNISONE 10 MG PO TABS: 20.0000 mg | ORAL_TABLET | Freq: Every day | ORAL | 0 refills | Status: DC

## 2016-06-29 ENCOUNTER — Encounter (HOSPITAL_COMMUNITY): Payer: Self-pay | Admitting: Emergency Medicine

## 2016-06-29 ENCOUNTER — Emergency Department (HOSPITAL_COMMUNITY)
Admission: EM | Admit: 2016-06-29 | Discharge: 2016-06-29 | Disposition: A | Discharge status: Home / Self Care | Payer: Self-pay | Attending: Emergency Medicine | Admitting: Emergency Medicine

## 2016-06-29 ENCOUNTER — Emergency Department (HOSPITAL_COMMUNITY): Payer: Self-pay

## 2016-06-29 DIAGNOSIS — M25462 Effusion, left knee: Secondary | ICD-10-CM | POA: Insufficient documentation

## 2016-06-29 DIAGNOSIS — F17210 Nicotine dependence, cigarettes, uncomplicated: Secondary | ICD-10-CM | POA: Insufficient documentation

## 2016-06-29 DIAGNOSIS — M109 Gout, unspecified: Secondary | ICD-10-CM | POA: Insufficient documentation

## 2016-06-29 HISTORY — DX: Pain in unspecified knee: M25.569

## 2016-06-29 LAB — SYNOVIAL CELL COUNT + DIFF, W/ CRYSTALS
Lymphocytes-Synovial Fld: 3 % (ref 0–20)
Monocyte-Macrophage-Synovial Fluid: 20 % — ABNORMAL LOW (ref 50–90)
Neutrophil, Synovial: 77 % — ABNORMAL HIGH (ref 0–25)
WBC, Synovial: 8580 /mm3 — ABNORMAL HIGH (ref 0–200)

## 2016-06-29 MED ORDER — LIDOCAINE HCL (PF) 1 % IJ SOLN
30.0000 mL | Freq: Once | INTRAMUSCULAR | Status: AC
  Administered 2016-06-29: 30 mL
  Filled 2016-06-29: qty 30

## 2016-06-29 MED ORDER — HYDROCODONE-ACETAMINOPHEN 5-325 MG PO TABS: 1.0000 | ORAL_TABLET | Freq: Four times a day (QID) | ORAL | 0 refills | Status: AC | PRN

## 2016-06-29 MED ORDER — HYDROCODONE-ACETAMINOPHEN 5-325 MG PO TABS
2.0000 | ORAL_TABLET | Freq: Once | ORAL | Status: AC
  Administered 2016-06-29: 2 via ORAL
  Filled 2016-06-29: qty 2

## 2016-06-29 MED ORDER — PREDNISONE 20 MG PO TABS: 40.0000 mg | ORAL_TABLET | Freq: Every day | ORAL | 0 refills | Status: AC

## 2016-06-29 NOTE — ED Provider Notes (Signed)
[facility]-EMER[facility]Y DEPT Provider Note   CSN: [id number] Arrival date & time: 06/29/16  1014  By signing my name below, I, Sonum Patel, attest that this documentation has been prepared under the direction and in the presence of Roxy Horseman, PA-C. Electronically Signed: Sonum Patel, Neurosurgeon. 06/29/16. 10:56 AM.  History   Chief Complaint Chief Complaint  Patient presents with  . Knee Pain  . Joint Swelling    The history is provided by the patient. No language interpreter was used.     HPI Comments: Joseph Rubio is a 51 y.o. male who presents to the Emergency Department complaining of constant, gradually worsened left knee pain and swelling for the past 1 week. He reports recurrent episodes of the same and was treated with prednisone in the past. He denies known injury or trauma to the affected area. He has taken ibuprofen and elevated the affected extremity without relief.   No past medical history on file.  There are no active problems to display for this patient.   Past Surgical History:  Procedure Laterality Date  . FRACTURE SURGERY     R hip  . HIP SURGERY         Home Medications    Prior to Admission medications   Medication Sig Start Date End Date Taking? Authorizing Provider  oxyCODONE-acetaminophen (PERCOCET/ROXICET) 5-325 MG tablet Take 1 tablet by mouth every 6 (six) hours as needed for severe pain. 02/28/16   Tiffany Neva Seat, PA-C  predniSONE (DELTASONE) 10 MG tablet Take 2 tablets (20 mg total) by mouth daily. 02/28/16   Marlon Pel, PA-C    Family History No family history on file.  Social History Social History  Substance Use Topics  . Smoking status: Current Some Day Smoker    Types: Cigarettes  . Smokeless tobacco: Never Used  . Alcohol use Yes     Comment: socially     Allergies   Patient has no known allergies.   Review of Systems Review of Systems  Musculoskeletal: Positive for arthralgias and joint swelling.  Neurological:  Negative for numbness.     Physical Exam Updated Vital Signs BP (!) 198/111 (BP Location: Left Arm)   Pulse 101   Resp 18   SpO2 98%   Physical Exam Physical Exam  Constitutional: Pt appears well-developed and well-nourished. No distress.  HENT:  Head: Normocephalic and atraumatic.  Eyes: Conjunctivae are normal.  Neck: Normal range of motion.  Cardiovascular: Normal rate, regular rhythm and intact distal pulses.   Capillary refill < 3 sec  Pulmonary/Chest: Effort normal and breath sounds normal.  Musculoskeletal:  Left Knee: Pt exhibits significant swelling and diffuse tenderness. ROM: 3/5 limited by pain  Neurological: Pt  is alert. Coordination normal.  Sensation 5/5 Strength 5/5, but with limited ROM  Skin: Skin is warm and dry. Pt is not diaphoretic.  No tenting of the skin  Psychiatric: Pt has a normal mood and affect.  Nursing note and vitals reviewed.   ED Treatments / Results  DIAGNOSTIC STUDIES: Oxygen Saturation is 98% on RA, normal by my interpretation.    COORDINATION OF CARE: 10:56 AM Discussed treatment plan with pt at bedside and pt agreed to plan.   Labs (all labs ordered are listed, but only abnormal results are displayed) Labs Reviewed  SYNOVIAL CELL COUNT + DIFF, W/ CRYSTALS - Abnormal; Notable for the following:       Result Value   Appearance-Synovial TURBID (*)    WBC, Synovial 8,580 (*)  Neutrophil, Synovial 77 (*)    Monocyte-Macrophage-Synovial Fluid 20 (*)    All other components within normal limits  BODY FLUID CULTURE  MISC [facility] TEST (SEND OUT)    EKG  EKG Interpretation None       Radiology Dg Knee Complete 4 Views Left  Result Date: 06/29/2016 CLINICAL DATA:  LEFT knee pain and swelling for a week, pain with knee flexion, no prior injury, limping with weight-bearing and ambulation. EXAM: LEFT KNEE - COMPLETE 4+ VIEW COMPARISON:  02/27/2016 FINDINGS: Osseous demineralization. Minimal joint space narrowing diffusely. No  acute fracture, dislocation, or bone destruction. Large knee joint effusion again identified. IMPRESSION: Large knee joint effusion and osseous demineralization without acute bony abnormalities. Electronically Signed   By: Ulyses SouthwardMark  Boles M.D.   On: 06/29/2016 12:01    Procedures .Joint Aspiration/Arthrocentesis Date/Time: 06/29/2016 2:05 PM Performed by: Roxy HorsemanBROWNING, Aydin Hink Authorized by: Roxy HorsemanBROWNING, Dameir Gentzler   Consent:    Consent obtained:  Verbal   Consent given by:  Patient   Risks discussed:  Incomplete drainage, infection and pain Location:    Location:  Knee   Knee:  L knee Anesthesia (see MAR for exact dosages):    Anesthesia method:  Local infiltration   Local anesthetic:  Lidocaine 1% w/o epi Procedure details:    Preparation: Patient was prepped and draped in usual sterile fashion     Needle gauge:  18 G   Ultrasound guidance: no     Approach:  Lateral   Aspirate amount:  150 ml   Aspirate characteristics:  Yellow   Steroid injected: no     Specimen collected: yes   Post-procedure details:    Dressing:  Adhesive bandage   Patient tolerance of procedure:  Tolerated well, no immediate complications   (including critical care time)  Medications Ordered in ED Medications - No data to display   Initial Impression / Assessment and Plan / ED Course  I have reviewed the triage vital signs and the nursing notes.  Pertinent labs & imaging results that were available during my care of the patient were reviewed by me and considered in my medical decision making (see chart for details).     Patient X-Ray negative for obvious fracture or dislocation.   Significant knee effusion.  The left knee is quite tender and warm.  Without trauma, I'm considering septic arthritis vs gout.  Will aspirate knee.  Discussed with Dr. Criss AlvineGoldston, who agrees with the plan.   Synovial fluid analysis consistent with gout.  Will treat with prednisone and pain meds.  Recommend follow-up with PCP.   Patient  given knee sleeve while in ED, conservative therapy recommended and discussed. Patient will be discharged home & is agreeable with above plan. Returns precautions discussed. Pt appears safe for discharge.   Final Clinical Impressions(s) / ED Diagnoses   Final diagnoses:  Acute gouty arthritis  Effusion of left knee    New Prescriptions Discharge Medication List as of 06/29/2016  3:31 PM    START taking these medications   Details  HYDROcodone-acetaminophen (NORCO/VICODIN) 5-325 MG tablet Take 1-2 tablets by mouth every 6 (six) hours as needed., Starting Fri 06/29/2016, Print       I personally performed the services described in this documentation, which was scribed in my presence. The recorded information has been reviewed and is accurate.      Roxy Horsemanobert Derricka Mertz, PA-C 06/29/16 1542    Pricilla LovelessScott Goldston, MD 07/05/16 [phone]

## 2016-06-29 NOTE — ED Triage Notes (Signed)
Pt reports recurrent l/kbnee pain. Hx of same. Denies trauma. L/knee is swollen, and painful when walking

## 2016-06-30 LAB — MISC LABCORP TEST (SEND OUT): Labcorp test code: 19497

## 2016-07-02 LAB — BODY FLUID CULTURE
CULTURE: NO GROWTH
Special Requests: NORMAL

## 2017-04-22 ENCOUNTER — Inpatient Hospital Stay (HOSPITAL_COMMUNITY): Payer: Medicaid Other

## 2017-04-22 ENCOUNTER — Emergency Department (HOSPITAL_COMMUNITY): Payer: Medicaid Other

## 2017-04-22 ENCOUNTER — Inpatient Hospital Stay (HOSPITAL_COMMUNITY)
Admission: EM | Admit: 2017-04-22 | Discharge: 2017-05-11 | DRG: 023 | Disposition: E | Payer: Medicaid Other | Attending: Neurology | Admitting: Neurology

## 2017-04-22 ENCOUNTER — Encounter (HOSPITAL_COMMUNITY): Payer: Self-pay

## 2017-04-22 DIAGNOSIS — I472 Ventricular tachycardia: Secondary | ICD-10-CM | POA: Diagnosis present

## 2017-04-22 DIAGNOSIS — I619 Nontraumatic intracerebral hemorrhage, unspecified: Secondary | ICD-10-CM

## 2017-04-22 DIAGNOSIS — E781 Pure hyperglyceridemia: Secondary | ICD-10-CM | POA: Diagnosis present

## 2017-04-22 DIAGNOSIS — J9601 Acute respiratory failure with hypoxia: Secondary | ICD-10-CM

## 2017-04-22 DIAGNOSIS — E876 Hypokalemia: Secondary | ICD-10-CM | POA: Diagnosis present

## 2017-04-22 DIAGNOSIS — J690 Pneumonitis due to inhalation of food and vomit: Secondary | ICD-10-CM | POA: Diagnosis present

## 2017-04-22 DIAGNOSIS — Z4659 Encounter for fitting and adjustment of other gastrointestinal appliance and device: Secondary | ICD-10-CM

## 2017-04-22 DIAGNOSIS — G935 Compression of brain: Secondary | ICD-10-CM | POA: Diagnosis present

## 2017-04-22 DIAGNOSIS — F1210 Cannabis abuse, uncomplicated: Secondary | ICD-10-CM | POA: Diagnosis present

## 2017-04-22 DIAGNOSIS — Z978 Presence of other specified devices: Secondary | ICD-10-CM

## 2017-04-22 DIAGNOSIS — Y902 Blood alcohol level of 40-59 mg/100 ml: Secondary | ICD-10-CM | POA: Diagnosis present

## 2017-04-22 DIAGNOSIS — I610 Nontraumatic intracerebral hemorrhage in hemisphere, subcortical: Principal | ICD-10-CM | POA: Diagnosis present

## 2017-04-22 DIAGNOSIS — I959 Hypotension, unspecified: Secondary | ICD-10-CM | POA: Diagnosis present

## 2017-04-22 DIAGNOSIS — D649 Anemia, unspecified: Secondary | ICD-10-CM | POA: Diagnosis present

## 2017-04-22 DIAGNOSIS — G9349 Other encephalopathy: Secondary | ICD-10-CM | POA: Diagnosis present

## 2017-04-22 DIAGNOSIS — J9383 Other pneumothorax: Secondary | ICD-10-CM | POA: Diagnosis present

## 2017-04-22 DIAGNOSIS — I5030 Unspecified diastolic (congestive) heart failure: Secondary | ICD-10-CM | POA: Diagnosis not present

## 2017-04-22 DIAGNOSIS — K5900 Constipation, unspecified: Secondary | ICD-10-CM | POA: Diagnosis present

## 2017-04-22 DIAGNOSIS — J939 Pneumothorax, unspecified: Secondary | ICD-10-CM

## 2017-04-22 DIAGNOSIS — J45909 Unspecified asthma, uncomplicated: Secondary | ICD-10-CM | POA: Diagnosis present

## 2017-04-22 DIAGNOSIS — I160 Hypertensive urgency: Secondary | ICD-10-CM | POA: Diagnosis present

## 2017-04-22 DIAGNOSIS — I615 Nontraumatic intracerebral hemorrhage, intraventricular: Secondary | ICD-10-CM | POA: Diagnosis present

## 2017-04-22 DIAGNOSIS — Z452 Encounter for adjustment and management of vascular access device: Secondary | ICD-10-CM

## 2017-04-22 DIAGNOSIS — G936 Cerebral edema: Secondary | ICD-10-CM | POA: Diagnosis present

## 2017-04-22 DIAGNOSIS — F1423 Cocaine dependence with withdrawal: Secondary | ICD-10-CM | POA: Diagnosis present

## 2017-04-22 DIAGNOSIS — G911 Obstructive hydrocephalus: Secondary | ICD-10-CM | POA: Diagnosis present

## 2017-04-22 DIAGNOSIS — F17200 Nicotine dependence, unspecified, uncomplicated: Secondary | ICD-10-CM | POA: Diagnosis present

## 2017-04-22 DIAGNOSIS — G8194 Hemiplegia, unspecified affecting left nondominant side: Secondary | ICD-10-CM | POA: Diagnosis present

## 2017-04-22 DIAGNOSIS — Z0189 Encounter for other specified special examinations: Secondary | ICD-10-CM

## 2017-04-22 DIAGNOSIS — E872 Acidosis: Secondary | ICD-10-CM | POA: Diagnosis present

## 2017-04-22 DIAGNOSIS — Z8673 Personal history of transient ischemic attack (TIA), and cerebral infarction without residual deficits: Secondary | ICD-10-CM

## 2017-04-22 DIAGNOSIS — F1020 Alcohol dependence, uncomplicated: Secondary | ICD-10-CM | POA: Diagnosis present

## 2017-04-22 DIAGNOSIS — J9600 Acute respiratory failure, unspecified whether with hypoxia or hypercapnia: Secondary | ICD-10-CM | POA: Diagnosis not present

## 2017-04-22 DIAGNOSIS — I361 Nonrheumatic tricuspid (valve) insufficiency: Secondary | ICD-10-CM | POA: Diagnosis not present

## 2017-04-22 DIAGNOSIS — E870 Hyperosmolality and hypernatremia: Secondary | ICD-10-CM | POA: Diagnosis not present

## 2017-04-22 DIAGNOSIS — G9382 Brain death: Secondary | ICD-10-CM | POA: Diagnosis not present

## 2017-04-22 DIAGNOSIS — Z781 Physical restraint status: Secondary | ICD-10-CM

## 2017-04-22 DIAGNOSIS — Z23 Encounter for immunization: Secondary | ICD-10-CM | POA: Diagnosis not present

## 2017-04-22 DIAGNOSIS — I10 Essential (primary) hypertension: Secondary | ICD-10-CM | POA: Diagnosis present

## 2017-04-22 DIAGNOSIS — F1410 Cocaine abuse, uncomplicated: Secondary | ICD-10-CM

## 2017-04-22 DIAGNOSIS — J9602 Acute respiratory failure with hypercapnia: Secondary | ICD-10-CM

## 2017-04-22 LAB — RAPID URINE DRUG SCREEN, HOSP PERFORMED
Amphetamines: NOT DETECTED
BARBITURATES: NOT DETECTED
Benzodiazepines: NOT DETECTED
Cocaine: POSITIVE — AB
Opiates: NOT DETECTED
Tetrahydrocannabinol: POSITIVE — AB

## 2017-04-22 LAB — HIV ANTIBODY (ROUTINE TESTING W REFLEX): HIV SCREEN 4TH GENERATION: NONREACTIVE

## 2017-04-22 LAB — COMPREHENSIVE METABOLIC PANEL
ALK PHOS: 74 U/L (ref 38–126)
ALT: 25 U/L (ref 17–63)
ANION GAP: 16 — AB (ref 5–15)
AST: 36 U/L (ref 15–41)
Albumin: 3.9 g/dL (ref 3.5–5.0)
BUN: 11 mg/dL (ref 6–20)
CALCIUM: 8.5 mg/dL — AB (ref 8.9–10.3)
CO2: 14 mmol/L — ABNORMAL LOW (ref 22–32)
CREATININE: 0.99 mg/dL (ref 0.61–1.24)
Chloride: 107 mmol/L (ref 101–111)
Glucose, Bld: 125 mg/dL — ABNORMAL HIGH (ref 65–99)
Potassium: 3.1 mmol/L — ABNORMAL LOW (ref 3.5–5.1)
SODIUM: 137 mmol/L (ref 135–145)
Total Bilirubin: 0.7 mg/dL (ref 0.3–1.2)
Total Protein: 7.9 g/dL (ref 6.5–8.1)

## 2017-04-22 LAB — DIFFERENTIAL
BASOS PCT: 1 %
Basophils Absolute: 0.1 10*3/uL (ref 0.0–0.1)
EOS PCT: 1 %
Eosinophils Absolute: 0.1 10*3/uL (ref 0.0–0.7)
LYMPHS PCT: 37 %
Lymphs Abs: 3.3 10*3/uL (ref 0.7–4.0)
MONO ABS: 0.5 10*3/uL (ref 0.1–1.0)
Monocytes Relative: 6 %
Neutro Abs: 5 10*3/uL (ref 1.7–7.7)
Neutrophils Relative %: 55 %

## 2017-04-22 LAB — BLOOD GAS, ARTERIAL
ACID-BASE DEFICIT: 9.8 mmol/L — AB (ref 0.0–2.0)
BICARBONATE: 15 mmol/L — AB (ref 20.0–28.0)
Drawn by: 44135
FIO2: 40
O2 SAT: 99.7 %
PEEP/CPAP: 5 cmH2O
Patient temperature: 98.6
RATE: 16 resp/min
VT: 500 mL
pCO2 arterial: 29.3 mmHg — ABNORMAL LOW (ref 32.0–48.0)
pH, Arterial: 7.329 — ABNORMAL LOW (ref 7.350–7.450)
pO2, Arterial: 375 mmHg — ABNORMAL HIGH (ref 83.0–108.0)

## 2017-04-22 LAB — GLUCOSE, CAPILLARY
GLUCOSE-CAPILLARY: 114 mg/dL — AB (ref 65–99)
GLUCOSE-CAPILLARY: 112 mg/dL — AB (ref 65–99)
GLUCOSE-CAPILLARY: 128 mg/dL — AB (ref 65–99)
GLUCOSE-CAPILLARY: 115 mg/dL — AB (ref 65–99)
Glucose-Capillary: 129 mg/dL — ABNORMAL HIGH (ref 65–99)
Glucose-Capillary: 135 mg/dL — ABNORMAL HIGH (ref 65–99)
Glucose-Capillary: 131 mg/dL — ABNORMAL HIGH (ref 65–99)

## 2017-04-22 LAB — CBG MONITORING, ED: Glucose-Capillary: 116 mg/dL — ABNORMAL HIGH (ref 65–99)

## 2017-04-22 LAB — I-STAT CHEM 8, ED
BUN: 13 mg/dL (ref 6–20)
CHLORIDE: 109 mmol/L (ref 101–111)
Calcium, Ion: 1.14 mmol/L — ABNORMAL LOW (ref 1.15–1.40)
Creatinine, Ser: 1 mg/dL (ref 0.61–1.24)
GLUCOSE: 123 mg/dL — AB (ref 65–99)
HEMATOCRIT: 42 % (ref 39.0–52.0)
Hemoglobin: 14.3 g/dL (ref 13.0–17.0)
POTASSIUM: 3.2 mmol/L — AB (ref 3.5–5.1)
Sodium: 140 mmol/L (ref 135–145)
TCO2: 14 mmol/L — ABNORMAL LOW (ref 22–32)

## 2017-04-22 LAB — CBC
HCT: 36.7 % — ABNORMAL LOW (ref 39.0–52.0)
Hemoglobin: 12 g/dL — ABNORMAL LOW (ref 13.0–17.0)
MCH: 29.9 pg (ref 26.0–34.0)
MCHC: 32.7 g/dL (ref 30.0–36.0)
MCV: 91.3 fL (ref 78.0–100.0)
PLATELETS: 313 10*3/uL (ref 150–400)
RBC: 4.02 MIL/uL — ABNORMAL LOW (ref 4.22–5.81)
RDW: 15.3 % (ref 11.5–15.5)
WBC: 9 10*3/uL (ref 4.0–10.5)

## 2017-04-22 LAB — SODIUM
SODIUM: 141 mmol/L (ref 135–145)
Sodium: 136 mmol/L (ref 135–145)
Sodium: 138 mmol/L (ref 135–145)
Sodium: 138 mmol/L (ref 135–145)

## 2017-04-22 LAB — I-STAT TROPONIN, ED: Troponin i, poc: 0 ng/mL (ref 0.00–0.08)

## 2017-04-22 LAB — HEMOGLOBIN A1C
HEMOGLOBIN A1C: 4.9 % (ref 4.8–5.6)
Mean Plasma Glucose: 93.93 mg/dL

## 2017-04-22 LAB — ETHANOL: ALCOHOL ETHYL (B): 40 mg/dL — AB (ref <10)

## 2017-04-22 LAB — PROTIME-INR
INR: 1.23
PROTHROMBIN TIME: 15.4 s — AB (ref 11.4–15.2)

## 2017-04-22 LAB — PHOSPHORUS: Phosphorus: 3.4 mg/dL (ref 2.5–4.6)

## 2017-04-22 LAB — APTT: aPTT: 28 seconds (ref 24–36)

## 2017-04-22 LAB — POTASSIUM: Potassium: 3.6 mmol/L (ref 3.5–5.1)

## 2017-04-22 LAB — TRIGLYCERIDES: Triglycerides: 1231 mg/dL — ABNORMAL HIGH (ref <150)

## 2017-04-22 LAB — MRSA PCR SCREENING: MRSA by PCR: NEGATIVE

## 2017-04-22 LAB — MAGNESIUM
MAGNESIUM: 2 mg/dL (ref 1.7–2.4)
Magnesium: 2 mg/dL (ref 1.7–2.4)

## 2017-04-22 MED ORDER — LABETALOL HCL 5 MG/ML IV SOLN
20.0000 mg | Freq: Once | INTRAVENOUS | Status: AC
  Administered 2017-04-22: 20 mg via INTRAVENOUS

## 2017-04-22 MED ORDER — LABETALOL HCL 5 MG/ML IV SOLN
INTRAVENOUS | Status: AC
  Filled 2017-04-22: qty 4

## 2017-04-22 MED ORDER — CLEVIDIPINE BUTYRATE 0.5 MG/ML IV EMUL
INTRAVENOUS | Status: AC
  Filled 2017-04-22: qty 50

## 2017-04-22 MED ORDER — STROKE: EARLY STAGES OF RECOVERY BOOK
Freq: Once | Status: DC
  Filled 2017-04-22 (×2): qty 1

## 2017-04-22 MED ORDER — AMLODIPINE BESYLATE 5 MG PO TABS
5.0000 mg | ORAL_TABLET | Freq: Every day | ORAL | Status: DC
  Administered 2017-04-22 – 2017-04-23 (×2): 5 mg via ORAL
  Filled 2017-04-22 (×3): qty 1

## 2017-04-22 MED ORDER — FENTANYL CITRATE (PF) 100 MCG/2ML IJ SOLN: 50.0000 ug | Freq: Once | INTRAMUSCULAR | Status: AC

## 2017-04-22 MED ORDER — ACETAMINOPHEN 650 MG RE SUPP: 650.0000 mg | RECTAL | Status: DC | PRN

## 2017-04-22 MED ORDER — ACETAMINOPHEN 325 MG PO TABS
650.0000 mg | ORAL_TABLET | ORAL | Status: DC | PRN
  Filled 2017-04-22: qty 2

## 2017-04-22 MED ORDER — FENTANYL CITRATE (PF) 100 MCG/2ML IJ SOLN
100.0000 ug | Freq: Once | INTRAMUSCULAR | Status: AC
  Administered 2017-04-22: 50 ug via INTRAVENOUS

## 2017-04-22 MED ORDER — FENTANYL CITRATE (PF) 100 MCG/2ML IJ SOLN
INTRAMUSCULAR | Status: AC
Start: 2017-04-22 — End: 2017-04-22
  Filled 2017-04-22: qty 2

## 2017-04-22 MED ORDER — FENTANYL CITRATE (PF) 100 MCG/2ML IJ SOLN
100.0000 ug | INTRAMUSCULAR | Status: DC | PRN
  Administered 2017-04-22 – 2017-04-26 (×9): 100 ug via INTRAVENOUS
  Filled 2017-04-22 (×3): qty 2

## 2017-04-22 MED ORDER — LABETALOL HCL 5 MG/ML IV SOLN
20.0000 mg | INTRAVENOUS | Status: DC | PRN
  Administered 2017-04-22: 20 mg via INTRAVENOUS
  Filled 2017-04-22: qty 4

## 2017-04-22 MED ORDER — CHLORHEXIDINE GLUCONATE 0.12% ORAL RINSE (MEDLINE KIT)
15.0000 mL | Freq: Two times a day (BID) | OROMUCOSAL | Status: DC
  Administered 2017-04-22 – 2017-04-29 (×15): 15 mL via OROMUCOSAL

## 2017-04-22 MED ORDER — VITAL AF 1.2 CAL PO LIQD
1000.0000 mL | ORAL | Status: DC
  Administered 2017-04-22 – 2017-04-28 (×11): 1000 mL
  Filled 2017-04-22 (×2): qty 1000

## 2017-04-22 MED ORDER — CLEVIDIPINE BUTYRATE 0.5 MG/ML IV EMUL
0.0000 mg/h | INTRAVENOUS | Status: DC
  Administered 2017-04-22 (×4): 21 mg/h via INTRAVENOUS
  Administered 2017-04-22: 1 mg/h via INTRAVENOUS
  Administered 2017-04-22: 21 mg/h via INTRAVENOUS
  Filled 2017-04-22 (×4): qty 50

## 2017-04-22 MED ORDER — SODIUM CHLORIDE 0.9 % IV SOLN
250.0000 mL | INTRAVENOUS | Status: DC | PRN
  Administered 2017-04-29: 250 mL via INTRAVENOUS

## 2017-04-22 MED ORDER — LABETALOL HCL 5 MG/ML IV SOLN
20.0000 mg | INTRAVENOUS | Status: DC | PRN
  Administered 2017-04-22 – 2017-04-23 (×2): 40 mg via INTRAVENOUS
  Filled 2017-04-22 (×2): qty 8

## 2017-04-22 MED ORDER — PROPOFOL 1000 MG/100ML IV EMUL
INTRAVENOUS | Status: AC
  Administered 2017-04-22: 40 mg/kg/h via INTRAVENOUS
  Filled 2017-04-22: qty 100

## 2017-04-22 MED ORDER — FENTANYL BOLUS VIA INFUSION
50.0000 ug | INTRAVENOUS | Status: DC | PRN
  Administered 2017-04-22: 50 ug via INTRAVENOUS
  Filled 2017-04-22: qty 50

## 2017-04-22 MED ORDER — SENNOSIDES-DOCUSATE SODIUM 8.6-50 MG PO TABS
1.0000 | ORAL_TABLET | Freq: Two times a day (BID) | ORAL | Status: DC
  Administered 2017-04-22 – 2017-04-24 (×5): 1 via ORAL
  Filled 2017-04-22 (×5): qty 1

## 2017-04-22 MED ORDER — ACETAMINOPHEN 160 MG/5ML PO SOLN
650.0000 mg | ORAL | Status: DC | PRN
  Administered 2017-04-23 – 2017-04-27 (×9): 650 mg
  Filled 2017-04-22 (×10): qty 20.3

## 2017-04-22 MED ORDER — PANTOPRAZOLE SODIUM 40 MG IV SOLR
40.0000 mg | Freq: Every day | INTRAVENOUS | Status: DC
  Administered 2017-04-22 – 2017-04-27 (×6): 40 mg via INTRAVENOUS
  Filled 2017-04-22 (×6): qty 40

## 2017-04-22 MED ORDER — NICARDIPINE HCL IN NACL 40-0.83 MG/200ML-% IV SOLN
3.0000 mg/h | INTRAVENOUS | Status: DC
  Administered 2017-04-22 (×3): 15 mg/h via INTRAVENOUS
  Administered 2017-04-23: 2.5 mg/h via INTRAVENOUS
  Administered 2017-04-23: 4 mg/h via INTRAVENOUS
  Administered 2017-04-24: 3 mg/h via INTRAVENOUS
  Administered 2017-04-24 (×2): 2.5 mg/h via INTRAVENOUS
  Filled 2017-04-22 (×6): qty 200

## 2017-04-22 MED ORDER — NICARDIPINE HCL IN NACL 20-0.86 MG/200ML-% IV SOLN
3.0000 mg/h | INTRAVENOUS | Status: DC
  Administered 2017-04-22: 5 mg/h via INTRAVENOUS
  Filled 2017-04-22: qty 200

## 2017-04-22 MED ORDER — SODIUM CHLORIDE 3 % IV SOLN
INTRAVENOUS | Status: DC
  Administered 2017-04-22: 50 mL/h via INTRAVENOUS
  Administered 2017-04-22: 70 mL/h via INTRAVENOUS
  Administered 2017-04-22: 60 mL/h via INTRAVENOUS
  Administered 2017-04-23 – 2017-04-26 (×10): 70 mL/h via INTRAVENOUS
  Filled 2017-04-22 (×20): qty 500

## 2017-04-22 MED ORDER — ORAL CARE MOUTH RINSE
15.0000 mL | OROMUCOSAL | Status: DC
  Administered 2017-04-22 – 2017-04-29 (×76): 15 mL via OROMUCOSAL

## 2017-04-22 MED ORDER — CLEVIDIPINE BUTYRATE 0.5 MG/ML IV EMUL
INTRAVENOUS | Status: AC
Start: 2017-04-22 — End: 2017-04-22
  Filled 2017-04-22: qty 50

## 2017-04-22 MED ORDER — PROPOFOL 1000 MG/100ML IV EMUL
0.0000 ug/kg/min | INTRAVENOUS | Status: DC
  Administered 2017-04-22: 65 ug/kg/min via INTRAVENOUS
  Administered 2017-04-22 (×3): 70 ug/kg/min via INTRAVENOUS
  Administered 2017-04-22 – 2017-04-23 (×3): 40 ug/kg/min via INTRAVENOUS
  Filled 2017-04-22 (×7): qty 100

## 2017-04-22 MED ORDER — ADULT MULTIVITAMIN LIQUID CH
15.0000 mL | Freq: Every day | ORAL | Status: DC
  Administered 2017-04-22 – 2017-04-29 (×8): 15 mL
  Filled 2017-04-22 (×8): qty 15

## 2017-04-22 MED ORDER — FENTANYL 2500MCG IN NS 250ML (10MCG/ML) PREMIX INFUSION
25.0000 ug/h | INTRAVENOUS | Status: DC
  Administered 2017-04-22: 50 ug/h via INTRAVENOUS
  Administered 2017-04-23: 250 ug/h via INTRAVENOUS
  Administered 2017-04-23: 150 ug/h via INTRAVENOUS
  Administered 2017-04-24: 225 ug/h via INTRAVENOUS
  Administered 2017-04-24 – 2017-04-25 (×2): 200 ug/h via INTRAVENOUS
  Administered 2017-04-25: 300 ug/h via INTRAVENOUS
  Administered 2017-04-26 (×2): 350 ug/h via INTRAVENOUS
  Administered 2017-04-26: 300 ug/h via INTRAVENOUS
  Administered 2017-04-26: 350 ug/h via INTRAVENOUS
  Administered 2017-04-27: 300 ug/h via INTRAVENOUS
  Administered 2017-04-27: 400 ug/h via INTRAVENOUS
  Administered 2017-04-27: 375 ug/h via INTRAVENOUS
  Administered 2017-04-28 – 2017-04-29 (×4): 300 ug/h via INTRAVENOUS
  Filled 2017-04-22 (×21): qty 250

## 2017-04-22 MED ORDER — POTASSIUM CHLORIDE 20 MEQ/15ML (10%) PO SOLN
40.0000 meq | Freq: Once | ORAL | Status: AC
  Administered 2017-04-22: 40 meq via ORAL
  Filled 2017-04-22: qty 30

## 2017-04-22 MED ORDER — HYDROCHLOROTHIAZIDE 25 MG PO TABS
25.0000 mg | ORAL_TABLET | Freq: Every day | ORAL | Status: DC
  Administered 2017-04-22 – 2017-04-29 (×8): 25 mg via ORAL
  Filled 2017-04-22 (×8): qty 1

## 2017-04-22 MED ORDER — FENTANYL CITRATE (PF) 100 MCG/2ML IJ SOLN
100.0000 ug | INTRAMUSCULAR | Status: DC | PRN
  Administered 2017-04-22: 100 ug via INTRAVENOUS
  Filled 2017-04-22 (×2): qty 2

## 2017-04-22 NOTE — Progress Notes (Signed)
OT Cancellation Note  Patient Details Name: Joseph Rubio MRN: [id number] DOB: Oct 07, 1965   Cancelled Treatment:    Reason Eval/Treat Not Completed: Medical issues which prohibited therapy. Pt with bedrest orders. Pt also intubated at this time. Will await increase in activity orders prior to initiating OT evaluation. Thank you for this referral!  Doristine Sectionharity A Dasie Chancellor, MS OTR/L  Pager: [phone]   Mercer Stallworth A Lizvette Lightsey May 29, 2017, 6:50 AM

## 2017-04-22 NOTE — Progress Notes (Signed)
SLP Cancellation Note  Patient Details Name: Leonides GrillsJeffery L Cassaro MRN: [id number] DOB: [birth year]   Cancelled treatment:       Reason Eval/Treat Not Completed: Medical issues which prohibited therapy(Intubated. Will f/u 11/13. )  Ferdinand LangoLeah Donisha Hoch MA, CCC-SLP [phone]  [name] [name] 04/21/2017, 8:29 AM

## 2017-04-22 NOTE — Progress Notes (Signed)
Pt had 5 second run of Vtach, pt never lost pulse.  CCM and Neuro MD paged, strip and EKG in chart.  Will monitor.

## 2017-04-22 NOTE — Progress Notes (Signed)
Chaplain responded to request by the ED to provide support for a family of the patient who is reportly is being accessed for possible stroke. Chaplain presented to the consult room,  provided a introduction to the patient's niece, who also had a young baby with her,  and offered words of encouragement. Chaplain followed up with the assigned Nurse for an update on the status of the patient, and was informed he had a room assignment on 4N, the niece was escorted to the waiting area, the staff was informed of her presence, and contact information for the Dr. To inform her of the patient's medical status. Chaplain will follow up as needed. Chaplain Janell QuietAudrey Dontre Laduca [phone]

## 2017-04-22 NOTE — H&P (Signed)
Admission H&P    Chief Complaint: Found down with left sided weakness  HPI: Joseph GulaJeff Rubio is an 51 y.o. male who was LKN at 2300 on Sunday night per EMS. He was left at home to babysit a child. When family returned, he was found down on the floor of the bathroom naked near the shower. Family suspected he had collapsed after getting out of the shower. He was found at 12:07 AM. He vomited at home prior to EMS arrival. En route they noted forced gaze deviation to the right with left sided weakness. On arrival to the ED he was able to speak to staff. During CT patient became altered. CT head showed a large posterior basal ganglia and thalamus hemorrhage on the right with intraventricular extension and mass effect upon the right half of the midbrain. He was able to state his PMHx to EMS en route as consisting of asthma only. He denied being on any medications.   PMHx: Asthma  No past surgical history on file.  No family history on file. Social History:  has no tobacco, alcohol, and drug history on file.  Allergies: Allergies not on file   (Not in a hospital admission)  ROS: Unable to obtain due to obtundation.  Physical Examination: Blood pressure (!) 255/145, pulse 95, resp. rate 15, SpO2 100 %.  HEENT-  [place]/AT  Lungs - Deteriorated to sonorous respirations following CT Abdomen - Nondistended Extremities - Warm and well perfused. No edema.   Neurologic Examination: Mental Status: Decreased level of consciousness with left sided neglect. Able to follow 2 simple commands, then does not respond to remainder of commands or questions. Nonverbal. Moans to noxious.  Cranial Nerves: II:  PERRL at 3 mm >> 2 mm bilaterally. No blink to threat.  III,IV, VI: Eyes initially tonically deviated to the right; unable to overcome with doll's head maneuver. 1-2 minutes later with eyes dysconjugate and slightly to right of midline.  V,VII: Left facial droop. Decreased responsiveness to left facial  stimulation.  VIII: Followed 2 commands. Unable to formally test. IX,X: Sonorous respirations. Unable to visualize palate.  XI: Head tonically rotated towards the right.  XII: Not following commands for testing.  Motor/Sensory: Brisk withdrawal of RUE and RLE to noxious with 4-5/5 strength. Grips examiner's hand to command.  LUE: Minimal flexion to noxious stimuli LLE: Weak withdrawal to noxious stimuli Increased tone LUE and LLE.  Deep Tendon Reflexes:  Hyperactive reflexes x 4, left worse than right. Plantars: Right: Equivocal Left: Equivocal Cerebellar/Gait: Unable to assess   Results for orders placed or performed during the hospital encounter of 04/13/2017 (from the past 48 hour(s))  Protime-INR     Status: Abnormal   Collection Time: 05/07/2017 12:40 AM  Result Value Ref Range   Prothrombin Time 15.4 (H) 11.4 - 15.2 seconds   INR 1.23   APTT     Status: None   Collection Time: 04/21/2017 12:40 AM  Result Value Ref Range   aPTT 28 24 - 36 seconds  CBC     Status: Abnormal   Collection Time: 04/12/2017 12:40 AM  Result Value Ref Range   WBC 9.0 4.0 - 10.5 K/uL   RBC 4.02 (L) 4.22 - 5.81 MIL/uL   Hemoglobin 12.0 (L) 13.0 - 17.0 g/dL   HCT 40.936.7 (L) 81.139.0 - 91.452.0 %   MCV 91.3 78.0 - 100.0 fL   MCH 29.9 26.0 - 34.0 pg   MCHC 32.7 30.0 - 36.0 g/dL   RDW 78.215.3 95.611.5 -  15.5 %   Platelets 313 150 - 400 K/uL  Differential     Status: None   Collection Time: 05/05/17 12:40 AM  Result Value Ref Range   Neutrophils Relative % 55 %   Neutro Abs 5.0 1.7 - 7.7 K/uL   Lymphocytes Relative 37 %   Lymphs Abs 3.3 0.7 - 4.0 K/uL   Monocytes Relative 6 %   Monocytes Absolute 0.5 0.1 - 1.0 K/uL   Eosinophils Relative 1 %   Eosinophils Absolute 0.1 0.0 - 0.7 K/uL   Basophils Relative 1 %   Basophils Absolute 0.1 0.0 - 0.1 K/uL  CBG monitoring, ED     Status: Abnormal   Collection Time: 05-05-2017 12:43 AM  Result Value Ref Range   Glucose-Capillary 116 (H) 65 - 99 mg/dL  I-Stat Chem 8, ED      Status: Abnormal   Collection Time: May 05, 2017 12:55 AM  Result Value Ref Range   Sodium 140 135 - 145 mmol/L   Potassium 3.2 (L) 3.5 - 5.1 mmol/L   Chloride 109 101 - 111 mmol/L   BUN 13 6 - 20 mg/dL   Creatinine, Ser 1.61 0.61 - 1.24 mg/dL   Glucose, Bld 096 (H) 65 - 99 mg/dL   Calcium, Ion 0.45 (L) 1.15 - 1.40 mmol/L   TCO2 14 (L) 22 - 32 mmol/L   Hemoglobin 14.3 13.0 - 17.0 g/dL   HCT 40.9 81.1 - 91.4 %  I-stat troponin, ED     Status: None   Collection Time: 05/05/2017  1:00 AM  Result Value Ref Range   Troponin i, poc 0.00 0.00 - 0.08 ng/mL   Comment 3            Comment: Due to the release kinetics of cTnI, a negative result within the first hours of the onset of symptoms does not rule out myocardial infarction with certainty. If myocardial infarction is still suspected, repeat the test at appropriate intervals.    Ct Cervical Spine Wo Contrast  Result Date: 2017/05/05 CLINICAL DATA:  Code stroke. 51 y/o M; right-sided weakness and days. EXAM: CT HEAD WITHOUT CONTRAST CT CERVICAL SPINE WITHOUT CONTRAST TECHNIQUE: Multidetector CT imaging of the head and cervical spine was performed following the standard protocol without intravenous contrast. Multiplanar CT image reconstructions of the cervical spine were also generated. COMPARISON:  None. FINDINGS: CT HEAD FINDINGS Brain: Acute hemorrhage centered in right thalamus measuring 3.9 x 2.9 x 3.6 cm (volume = 21 cm^3). The hemorrhage has decompressed into the ventricular system with large volume of hemorrhage in right lateral ventricle and small volume of hemorrhage in the third ventricle as well as pooling in occipital horn of the left lateral ventricle. 5 mm right-to-left midline shift from mass effect and minimal downward compression of the midbrain. Mild hydrocephalus. No downward herniation, effacement of basilar cisterns, or large vascular territory stroke. Vascular: No hyperdense vessel or unexpected calcification. Skull: Normal.  Negative for fracture or focal lesion. Sinuses/Orbits: Severe left maxillary sinus mucosal thickening in chronic inflammatory changes of the walls of the sinus. Chronic lamina papyracea fracture on the right. Otherwise negative. Other: None. CT CERVICAL SPINE FINDINGS Alignment: Straightening of cervical lordosis.  No listhesis. Skull base and vertebrae: No acute fracture. No primary bone lesion or focal pathologic process. Soft tissues and spinal canal: No prevertebral fluid or swelling. No visible canal hematoma. Disc levels: Mild cervical spondylosis with discogenic degenerative changes at the C5-C7 levels where there is mild loss of intervertebral disc space height  and anterior marginal osteophytes. No significant bony foraminal or canal stenosis. Upper chest: Negative. Other: Negative. IMPRESSION: 1. Acute hemorrhage centered in right thalamus, 21 cc. 2. Extension of hemorrhage into the ventricular system with large volume and right lateral ventricle and small volume within left lateral ventricle and third ventricle. 3. Mass effect with 5 mm right-to-left midline shift and minimal downward compression of the midbrain. 4. Mild hydrocephalus of lateral and third ventricles. 5. No acute fracture or dislocation of the cervical spine. These results were called by telephone at the time of interpretation on 04/30/2017 at 1:10 am to Dr. Otelia Limes, who verbally acknowledged these results. Electronically Signed   By: Mitzi Hansen M.D.   On: 04/14/2017 01:13   Ct Head Code Stroke Wo Contrast  Result Date: 05/10/2017 CLINICAL DATA:  Code stroke. 51 y/o M; right-sided weakness and days. EXAM: CT HEAD WITHOUT CONTRAST CT CERVICAL SPINE WITHOUT CONTRAST TECHNIQUE: Multidetector CT imaging of the head and cervical spine was performed following the standard protocol without intravenous contrast. Multiplanar CT image reconstructions of the cervical spine were also generated. COMPARISON:  None. FINDINGS: CT HEAD  FINDINGS Brain: Acute hemorrhage centered in right thalamus measuring 3.9 x 2.9 x 3.6 cm (volume = 21 cm^3). The hemorrhage has decompressed into the ventricular system with large volume of hemorrhage in right lateral ventricle and small volume of hemorrhage in the third ventricle as well as pooling in occipital horn of the left lateral ventricle. 5 mm right-to-left midline shift from mass effect and minimal downward compression of the midbrain. Mild hydrocephalus. No downward herniation, effacement of basilar cisterns, or large vascular territory stroke. Vascular: No hyperdense vessel or unexpected calcification. Skull: Normal. Negative for fracture or focal lesion. Sinuses/Orbits: Severe left maxillary sinus mucosal thickening in chronic inflammatory changes of the walls of the sinus. Chronic lamina papyracea fracture on the right. Otherwise negative. Other: None. CT CERVICAL SPINE FINDINGS Alignment: Straightening of cervical lordosis.  No listhesis. Skull base and vertebrae: No acute fracture. No primary bone lesion or focal pathologic process. Soft tissues and spinal canal: No prevertebral fluid or swelling. No visible canal hematoma. Disc levels: Mild cervical spondylosis with discogenic degenerative changes at the C5-C7 levels where there is mild loss of intervertebral disc space height and anterior marginal osteophytes. No significant bony foraminal or canal stenosis. Upper chest: Negative. Other: Negative. IMPRESSION: 1. Acute hemorrhage centered in right thalamus, 21 cc. 2. Extension of hemorrhage into the ventricular system with large volume and right lateral ventricle and small volume within left lateral ventricle and third ventricle. 3. Mass effect with 5 mm right-to-left midline shift and minimal downward compression of the midbrain. 4. Mild hydrocephalus of lateral and third ventricles. 5. No acute fracture or dislocation of the cervical spine. These results were called by telephone at the time of  interpretation on 04/25/2017 at 1:10 am to Dr. Otelia Limes, who verbally acknowledged these results. Electronically Signed   By: Mitzi Hansen M.D.   On: 04/25/2017 01:13    Assessment: 51 y.o. 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 Neurological condition rapidly deteriorating. Initially verbal on arrival. Following CT head he is now with left hemiplegia, decreased level of consciousness and inability to communicate.  2. GCS of 15 initially based upon EMS description, deteriorated rapidly to 12 at time of Neurological evaluation following CT head 3. Unable to protect airway.  Being intubated by ED physician 4. Severe hypertension. 5. History of asthma  Plan: 1. Admitting to ICU under Neurology service 2. CCM being consulted for airway management following intubation 3. Neurosurgery has been called with STAT consult for possible emergent ventriculostomy. Awaiting callback. 4. Will start hypertonic saline.  5. HOB to 45 degrees after intubation 6. BP management with labetalol and clevidipine 7. No antiplatelet medications or anticoagulants. DVT prophylaxis with SCDs 8. MRI and MRA brain when stable 9. Toxicology screen 10. Frequent neuro checks 11. Telemetry monitoring 12. Repeat CT head in 8 hours   40 minutes spent in the emergent Neurological evaluation and management of this critically ill patient  Electronically signed: Dr. Caryl PinaEric Khameron Gruenwald 07-09-2016, 1:18 AM

## 2017-04-22 NOTE — Progress Notes (Signed)
PT Cancellation Note  Patient Details Name: Joseph Rubio MRN: [id number] DOB: 08-18-1965   Cancelled Treatment:    Reason Eval/Treat Not Completed: Patient not medically ready. Pt currently on bedrest. Will await increased activity orders prior to initiating PT eval.    Marylynn PearsonLaura D Aracelie Addis 05/03/2017, 7:14 AM   Conni SlipperLaura Livian Vanderbeck, PT, DPT Acute Rehabilitation Services Pager: [phone]

## 2017-04-22 NOTE — Progress Notes (Signed)
STROKE TEAM PROGRESS NOTE   HISTORY OF PRESENT ILLNESS (per record)  Joseph Rubio is an 51 y.o. male who was LKN at 2300 on Sunday night per EMS. He was left at home to babysit a child. When family returned, he was found down on the floor of the bathroom naked near the shower. Family suspected he had collapsed after getting out of the shower. He was found at 12:07 AM. He vomited at home prior to EMS arrival. En route they noted forced gaze deviation to the right with left sided weakness. On arrival to the ED he was able to speak to staff. During CT patient became altered. CT head showed a large posterior basal ganglia and thalamus hemorrhage on the right with intraventricular extension and mass effect upon the right half of the midbrain. He was able to state his PMHx to EMS en route as consisting of asthma only. He denied being on any medications Patient was not administered IV t-PA secondary to intracerebral hemorrhage He was admitted to the neuro ICU for further evaluation and treatment.   SUBJECTIVE (INTERVAL HISTORY) His  Cousin sisters are at the bedside. Marland Kitchen He remains neurologically unresponsive requiring sedation and has purposeful on the right but plegic on the left. Blood pressure is controlled on Cardene drip but it is maxed out. Follow-up CT scan earlier this morning shows stable appearance of the large right basal ganglia hemorrhage with intraventricular extension and mild hydrocephalus. Hypertonic saline drip has been started.   OBJECTIVE Temp:  [98.4 F (36.9 C)-99.2 F (37.3 C)] 99.2 F (37.3 C) (11/12 1159) Pulse Rate:  [71-108] 71 (11/12 1500) Cardiac Rhythm: Normal sinus rhythm (11/12 0800) Resp:  [14-28] 16 (11/12 1500) BP: (124-264)/(75-168) 137/78 (11/12 1500) SpO2:  [100 %] 100 % (11/12 1500) FiO2 (%):  [40 %-100 %] 40 % (11/12 1052) Weight:  [161 lb 6 oz (73.2 kg)-163 lb 9.3 oz (74.2 kg)] 163 lb 9.3 oz (74.2 kg) (11/12 0300)  CBC:  Recent Labs  Lab 05/10/2017 0040  04/21/2017 0055  WBC 9.0  --   NEUTROABS 5.0  --   HGB 12.0* 14.3  HCT 36.7* 42.0  MCV 91.3  --   PLT 313  --     Basic Metabolic Panel:  Recent Labs  Lab 05/03/2017 0040 04/15/2017 0055 04/30/2017 0217 04/25/2017 0730  NA 137 140 136 138  K 3.1* 3.2*  --   --   CL 107 109  --   --   CO2 14*  --   --   --   GLUCOSE 125* 123*  --   --   BUN 11 13  --   --   CREATININE 0.99 1.00  --   --   CALCIUM 8.5*  --   --   --     Lipid Panel:     Component Value Date/Time   TRIG 1,231 (H) 04/21/2017 0730   HgbA1c:  Lab Results  Component Value Date   HGBA1C 4.9 05/05/2017   Urine Drug Screen:     Component Value Date/Time   LABOPIA NONE DETECTED 04/13/2017 0615   COCAINSCRNUR POSITIVE (A) 04/19/2017 0615   LABBENZ NONE DETECTED 04/14/2017 0615   AMPHETMU NONE DETECTED 05/04/2017 0615   THCU POSITIVE (A) 04/20/2017 0615   LABBARB NONE DETECTED 04/24/2017 0615    Alcohol Level     Component Value Date/Time   ETH 40 (H) 05/10/2017 0133    IMAGING  Ct Head Wo Contrast  Result Date: 04/21/2017 CLINICAL  DATA:  Follow-up intracranial hemorrhage. EXAM: CT HEAD WITHOUT CONTRAST TECHNIQUE: Contiguous axial images were obtained from the base of the skull through the vertex without intravenous contrast. COMPARISON:  CT HEAD 05-18-2017 at 0054 hours FINDINGS: BRAIN: 2.8 x 4.4 x 3.6 cm (volume = 23 cc, previously 21 cc) RIGHT thalamic hematoma is slightly larger with surrounding low-density vasogenic edema. Again noted is intraventricular extension with mild hydrocephalus. 3 mm RIGHT to LEFT midline shift is similar. Narrowed though patent basal cisterns. 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 No acute large vascular territory infarcts. Minimal white matter hypodensities compatible with mild chronic small vessel ischemic disease. No abnormal extra-axial fluid collections. VASCULAR: Unremarkable. SKULL/SOFT TISSUES: No skull fracture. No significant soft tissue swelling. ORBITS/SINUSES: The  included ocular globes and orbital contents are normal.The mastoid aircells and included paranasal sinuses are well-aerated. OTHER: None. IMPRESSION: 1. Evolving acute 2.8 x 4.4 x 3.6 cm RIGHT thalamus intraparenchymal hematoma. 2. Similar intraventricular extension of hemorrhage and mild hydrocephalus. 3 mm RIGHT to LEFT midline shift. 3. 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 Electronically Signed   By: Awilda Metro M.D.   On: 2017/05/18 03:24   Ct Cervical Spine Wo Contrast  Result Date: 18-May-2017 CLINICAL DATA:  Code stroke. 51 y/o M; right-sided weakness and days. EXAM: CT HEAD WITHOUT CONTRAST CT CERVICAL SPINE WITHOUT CONTRAST TECHNIQUE: Multidetector CT imaging of the head and cervical spine was performed following the standard protocol without intravenous contrast. Multiplanar CT image reconstructions of the cervical spine were also generated. COMPARISON:  None. FINDINGS: CT HEAD FINDINGS Brain: Acute hemorrhage centered in right thalamus measuring 3.9 x 2.9 x 3.6 cm (volume = 21 cm^3). The hemorrhage has decompressed into the ventricular system with large volume of hemorrhage in right lateral ventricle and small volume of hemorrhage in the third ventricle as well as pooling in occipital horn of the left lateral ventricle. 5 mm right-to-left midline shift from mass effect and minimal downward compression of the midbrain. Mild hydrocephalus. No downward herniation, effacement of basilar cisterns, or large vascular territory stroke. Vascular: No hyperdense vessel or unexpected calcification. Skull: Normal. Negative for fracture or focal lesion. Sinuses/Orbits: Severe left maxillary sinus mucosal thickening in chronic inflammatory changes of the walls of the sinus. Chronic lamina papyracea fracture on the right. Otherwise negative. Other: None. CT CERVICAL SPINE FINDINGS Alignment: Straightening of cervical lordosis.  No listhesis. Skull base and vertebrae:  No acute fracture. No primary bone lesion or focal pathologic process. Soft tissues and spinal canal: No prevertebral fluid or swelling. No visible canal hematoma. Disc levels: Mild cervical spondylosis with discogenic degenerative changes at the C5-C7 levels where there is mild loss of intervertebral disc space height and anterior marginal osteophytes. No significant bony foraminal or canal stenosis. Upper chest: Negative. Other: Negative. IMPRESSION: 1. Acute hemorrhage centered in right thalamus, 21 cc. 2. Extension of hemorrhage into the ventricular system with large volume and right lateral ventricle and small volume within left lateral ventricle and third ventricle. 3. Mass effect with 5 mm right-to-left midline shift and minimal downward compression of the midbrain. 4. Mild hydrocephalus of lateral and third ventricles. 5. No acute fracture or dislocation of the cervical spine. These results were called by telephone at the time of interpretation on 2017/05/18 at 1:10 am to Dr. Otelia Limes, who verbally acknowledged these results. Electronically Signed   By: Mitzi Hansen M.D.   On: 2017-05-18 01:13   Dg Chest Portable 1 View  Result Date:  04/11/2017 CLINICAL DATA:  51 year old male with acute intracranial hemorrhage status post intubation. EXAM: PORTABLE CHEST 1 VIEW COMPARISON:  CT of the head and neck dated 04/21/2017 FINDINGS: An endotracheal tube is noted with tip projecting at the level of the right fourth costovertebral junction. The carina is poorly visualized, however the tip of the tube is likely approximately 3 cm above the carina. An enteric tube extends into the left hemiabdomen with tip beyond the inferior margin of the image. The lungs are clear. There is no pleural effusion or pneumothorax. The cardiac silhouette is within normal limits. No acute osseous pathology. IMPRESSION: Endotracheal tube with tip appearing above the carina. Enteric tube extends into the left hemiabdomen with  tip beyond the inferior margin of the image. Electronically Signed   By: Elgie CollardArash  Radparvar M.D.   On: 04/21/2017 01:51   Dg Abd Portable 1v  Result Date: 04/15/2017 CLINICAL DATA:  Orogastric tube placement. EXAM: PORTABLE ABDOMEN - 1 VIEW COMPARISON:  None. FINDINGS: Orogastric tube tip projects in proximal stomach, side port past GE junction. Included bowel gas pattern is nondilated and nonobstructive. Lung bases are clear. Soft tissue planes and included osseous structures are normal. IMPRESSION: Orogastric tube tip projects in proximal stomach. Electronically Signed   By: Awilda Metroourtnay  Bloomer M.D.   On: 04/27/2017 05:39   Ct Head Code Stroke Wo Contrast  Result Date: 04/18/2017 CLINICAL DATA:  Code stroke. 51 y/o M; right-sided weakness and days. EXAM: CT HEAD WITHOUT CONTRAST CT CERVICAL SPINE WITHOUT CONTRAST TECHNIQUE: Multidetector CT imaging of the head and cervical spine was performed following the standard protocol without intravenous contrast. Multiplanar CT image reconstructions of the cervical spine were also generated. COMPARISON:  None. FINDINGS: CT HEAD FINDINGS Brain: Acute hemorrhage centered in right thalamus measuring 3.9 x 2.9 x 3.6 cm (volume = 21 cm^3). The hemorrhage has decompressed into the ventricular system with large volume of hemorrhage in right lateral ventricle and small volume of hemorrhage in the third ventricle as well as pooling in occipital horn of the left lateral ventricle. 5 mm right-to-left midline shift from mass effect and minimal downward compression of the midbrain. Mild hydrocephalus. No downward herniation, effacement of basilar cisterns, or large vascular territory stroke. Vascular: No hyperdense vessel or unexpected calcification. Skull: Normal. Negative for fracture or focal lesion. Sinuses/Orbits: Severe left maxillary sinus mucosal thickening in chronic inflammatory changes of the walls of the sinus. Chronic lamina papyracea fracture on the right. Otherwise  negative. Other: None. CT CERVICAL SPINE FINDINGS Alignment: Straightening of cervical lordosis.  No listhesis. Skull base and vertebrae: No acute fracture. No primary bone lesion or focal pathologic process. Soft tissues and spinal canal: No prevertebral fluid or swelling. No visible canal hematoma. Disc levels: Mild cervical spondylosis with discogenic degenerative changes at the C5-C7 levels where there is mild loss of intervertebral disc space height and anterior marginal osteophytes. No significant bony foraminal or canal stenosis. Upper chest: Negative. Other: Negative. IMPRESSION: 1. Acute hemorrhage centered in right thalamus, 21 cc. 2. Extension of hemorrhage into the ventricular system with large volume and right lateral ventricle and small volume within left lateral ventricle and third ventricle. 3. Mass effect with 5 mm right-to-left midline shift and minimal downward compression of the midbrain. 4. Mild hydrocephalus of lateral and third ventricles. 5. No acute fracture or dislocation of the cervical spine. These results were called by telephone at the time of interpretation on 05/02/2017 at 1:10 am to Dr. Otelia LimesLindzen, who verbally acknowledged these results. Electronically Signed  By: Mitzi Hansen M.D.   On: 05/03/2017 01:13       PHYSICAL EXAM Middle-aged African-American male who is sedated and intubated. . Afebrile. Head is nontraumatic. Neck is supple without bruit.    Cardiac exam no murmur or gallop. Lungs are clear to auscultation. Distal pulses are well felt. Neurological Exam : Patient is sedated and intubated. Eyes are closed. Pupils are small 2 mm sluggishly reactive. There is skew eye deviation with right eye downward and outward. Corneal reflexes are preserved. Dolls eye moments are sluggish. Cough and gag reflexes are present. He does have respiratory effort above ventilator settings he moves right upper and lower extremity semi-purposefully to sternal rub. No movement  in the left upper extremity and trace movement in the left lower extremity to painful stimuli. Tone is normal on the right and decreased on the left. Left plantar upgoing right downgoing. ASSESSMENT/PLAN Mr. AVROHOM MCKELVIN is a 51 y.o. 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 Again noted is intraventricular extension with mild hydrocephalus. 3 mm RIGHT to LEFT midline shift is similar. Narrowed though patent basal cisterns. 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 No acute large vascular territory infarcts  MRI head not ordered  MRA head not ordered  Carotid Doppler  not ordered  2D Echo  pending  LDL unable to calculate  HgbA1c pending   SCDs for VTE prophylaxis  Diet NPO time specified  No antithrombotic prior to admission, now on No antithrombotic due to ICH  Therapy recommendations:  On hold Disposition:  pending Hypertension  Unstable  Permissive hypertension (OK if < 220/120) but gradually normalize in 5-7 days  Long-term BP goal normotensive  Hyperlipidemia  Home meds:  none  LDL cannot calculate goal < 70  Diabetes  HgbA1c pending goal < 7.0   Other Active Problems  Respiratory failure  Cocaine and marijuana abuse  Hospital day # 0  I have personally examined this patient, reviewed notes, independently viewed imaging studies, participated in medical decision making and plan of care.ROS completed by me personally and pertinent positives fully documented  I have made any additions or clarifications directly to the above note.  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 His prognosis is guarded. Recommend strict blood pressure control with the systolic blood pressure goal below 140 for 24 hours and then below 180. Hypertonic saline for cytotoxic edema with sodium goal 150-155. Await neurosurgical opinion on ventriculostomy decision. Continue intubation. Check CT angiogram of the brain tomorrow morning. Long discussion on the bedside with the patient's cousins and about his prognosis, plan of care and answered questions. Discussed with Dr. Merlene Pulling from critical care medicine. This patient is critically ill and at significant risk of neurological worsening, death and care requires constant monitoring of vital signs, hemodynamics,respiratory and cardiac monitoring, extensive review of multiple databases, frequent neurological assessment, discussion with family, other specialists and medical decision making of high complexity.I have made any additions or clarifications directly to the above note.This critical care time does not reflect  procedure time, or teaching time or supervisory time of PA/NP/Med Resident etc but could involve care discussion time.  I spent 50 minutes of neurocritical care time  in the care of  this patient.     Delia HeadyPramod Jasun Gasparini, MD Medical Director [facility] Pager: [phone] 2016-06-14 3:29 PM   To contact Stroke Continuity provider, please refer to [email or web address]. After hours, contact General Neurology

## 2017-04-22 NOTE — Progress Notes (Signed)
Repeat CT head is essentially unchanged. Continue hypertonic saline. Will discuss with Dr. Mikal Planeabell.   Electronically signed: Dr. Caryl PinaEric Oden Lindaman

## 2017-04-22 NOTE — ED Triage Notes (Signed)
Pt transported from home by EMS after being found down in shower, pt last spoke to family @ 2200 tonight. Pt arrived speaking to staff, during CT Pt became altered.  Pt returned to trauma B

## 2017-04-22 NOTE — Progress Notes (Signed)
MD paged for BP 141/88 and 143/89, cleviprex gtt at 21. MD feels further management of BP with additional pharmaceutical measures would be a detriment to patient d/t BP being borderline above orders. RN to page MD if BP reaches over 150. Will continue to monitor pt.

## 2017-04-22 NOTE — Progress Notes (Signed)
Initial Nutrition Assessment  INTERVENTION:   Vital AF 1.2 @ 65 ml/hr (1560 ml/day) Provides: 1872 kcal, 117 grams protein, and 1265 ml free water.   MVI daily  NUTRITION DIAGNOSIS:   Inadequate oral intake related to inability to eat as evidenced by NPO status.  GOAL:   Patient will meet greater than or equal to 90% of their needs  MONITOR:   Vent status, TF tolerance  REASON FOR ASSESSMENT:   Consult, Ventilator Enteral/tube feeding initiation and management  ASSESSMENT:   Pt with PMH of asthma admitted with R thalamic hemorrhage with interventricular hemorrhage and 5 mm shift, noted positive ETOH/cocaine.     Pt discussed during ICU rounds and with RN.  Spoke with RN about concerns with elevated TG and high rates of propofol (30.7 ml/hr) and cleviprex (42 ml/hr). Pt is receiving almost 3000 kcal from lipid.  RN to contact MD to change meds as TG >1000.   Spoke to significant other in pt room. She reports that pt starts to drink in the morning but is not aware of how much he drinks. He does work but starts to drink before work. She thinks he is the same weight as usual. She is not aware of what pt eats.   Patient is currently intubated on ventilator support MV: 9 L/min Temp (24hrs), Avg:98.7 F (37.1 C), Min:98.4 F (36.9 C), Max:99.2 F (37.3 C)  Propofol: weaning, fentanyl drip started Cleviprex: now off Medications reviewed and include: senokot-s Labs reviewed: TG: 1231   NUTRITION - FOCUSED PHYSICAL EXAM:    Most Recent Value  Orbital Region  No depletion  Upper Arm Region  No depletion  Thoracic and Lumbar Region  No depletion  Buccal Region  No depletion  Temple Region  No depletion  Clavicle Bone Region  No depletion  Clavicle and Acromion Bone Region  No depletion  Scapular Bone Region  No depletion  Dorsal Hand  No depletion  Patellar Region  No depletion  Anterior Thigh Region  No depletion  Posterior Calf Region  No depletion  Edema (RD  Assessment)  None  Hair  Reviewed  Eyes  Unable to assess  Mouth  Unable to assess  Skin  Reviewed  Nails  Reviewed       Diet Order:  Diet NPO time specified  EDUCATION NEEDS:   No education needs have been identified at this time  Skin:  Skin Assessment: Reviewed RN Assessment  Last BM:  unknown  Height:   Ht Readings from Last 1 Encounters:  05/10/2017 5\' 8"  (1.727 m)    Weight:   Wt Readings from Last 1 Encounters:  04/16/2017 163 lb 9.3 oz (74.2 kg)    Ideal Body Weight:  70 kg  BMI:  Body mass index is 24.87 kg/m.  Estimated Nutritional Needs:   Kcal:  1817  Protein:  95-115 grams  Fluid:  > 1.8 L/day  Kendell BaneHeather Odester Nilson RD, LDN, CNSC [phone] Pager [phone] After Hours Pager

## 2017-04-22 NOTE — Progress Notes (Signed)
Patient transported from ED to 4N29 with no complications. 

## 2017-04-22 NOTE — Code Documentation (Signed)
Code Stroke, NIH 28, CT + Right Thalamic Hemorrhage, 5mm R-L shift  Neurology MD called NSU x 1, requested 2nd attempt be made, I called the answering service at [phone].  Awaiting NSU callback to Neurology, patient transferred to [phone]

## 2017-04-22 NOTE — ED Provider Notes (Signed)
[facility] EMERGENCY DEPARTMENT Provider Note   CSN: [id number] Arrival date & time: 05/04/2017  0041     History   Chief Complaint Chief Complaint  Patient presents with  . Code Stroke    HPI Joseph Rubio is a 51 y.o. male.  Patient brought to the emergency department for evaluation as a code stroke.  Patient brought from home by ambulance.  Patient had been home alone, family came home and found him face down on the floor.  They report that they had talked to him approximately 1 hour prior to that and he was doing well.  EMS report significant left-sided weakness with right gaze preference.  He is, however, answering questions appropriately. Level V Caveat due to acuity.      No past medical history on file.  Patient Active Problem List   Diagnosis Date Noted  . ICH (intracerebral hemorrhage) (HCC) 2017-05-04    No past surgical history on file.     Home Medications    Prior to Admission medications   Not on File    Family History No family history on file.  Social History Social History   Tobacco Use  . Smoking status: Not on file  Substance Use Topics  . Alcohol use: Not on file  . Drug use: Not on file     Allergies   Patient has no allergy information on record.   Review of Systems Review of Systems  Unable to perform ROS: Acuity of condition     Physical Exam Updated Vital Signs BP (!) 124/115   Pulse 93   Resp (!) 25   Wt 73.2 kg (161 lb 6 oz)   SpO2 100%   Physical Exam  Constitutional: He appears well-developed and well-nourished.  HENT:  Head: Normocephalic.  Eyes:  Rightward gaze, does not track across midline  Neck: Neck supple.  Cardiovascular: Tachycardia present. Exam reveals no gallop and no friction rub.  No murmur heard. Pulmonary/Chest: Effort normal and breath sounds normal.  Abdominal: Soft.  Musculoskeletal: He exhibits no edema or deformity.  Neurological: He is alert. He displays abnormal  reflex (hyperreflexia left side). A cranial nerve deficit (left facial droop) is present. He exhibits abnormal muscle tone (left hemiparesis).  Skin: Skin is warm and dry.     ED Treatments / Results  Labs (all labs ordered are listed, but only abnormal results are displayed) Labs Reviewed  PROTIME-INR - Abnormal; Notable for the following components:      Result Value   Prothrombin Time 15.4 (*)    All other components within normal limits  CBC - Abnormal; Notable for the following components:   RBC 4.02 (*)    Hemoglobin 12.0 (*)    HCT 36.7 (*)    All other components within normal limits  COMPREHENSIVE METABOLIC PANEL - Abnormal; Notable for the following components:   Potassium 3.1 (*)    CO2 14 (*)    Glucose, Bld 125 (*)    Calcium 8.5 (*)    Anion gap 16 (*)    All other components within normal limits  CBG MONITORING, ED - Abnormal; Notable for the following components:   Glucose-Capillary 116 (*)    All other components within normal limits  I-STAT CHEM 8, ED - Abnormal; Notable for the following components:   Potassium 3.2 (*)    Glucose, Bld 123 (*)    Calcium, Ion 1.14 (*)    TCO2 14 (*)    All other components  within normal limits  APTT  DIFFERENTIAL  HIV ANTIBODY (ROUTINE TESTING)  ETHANOL  HEMOGLOBIN A1C  RAPID URINE DRUG SCREEN, HOSP PERFORMED  I-STAT TROPONIN, ED    EKG  EKG Interpretation None       Radiology Ct Cervical Spine Wo Contrast  Result Date: 05/05/2017 CLINICAL DATA:  Code stroke. 51 y/o M; right-sided weakness and days. EXAM: CT HEAD WITHOUT CONTRAST CT CERVICAL SPINE WITHOUT CONTRAST TECHNIQUE: Multidetector CT imaging of the head and cervical spine was performed following the standard protocol without intravenous contrast. Multiplanar CT image reconstructions of the cervical spine were also generated. COMPARISON:  None. FINDINGS: CT HEAD FINDINGS Brain: Acute hemorrhage centered in right thalamus measuring 3.9 x 2.9 x 3.6 cm  (volume = 21 cm^3). The hemorrhage has decompressed into the ventricular system with large volume of hemorrhage in right lateral ventricle and small volume of hemorrhage in the third ventricle as well as pooling in occipital horn of the left lateral ventricle. 5 mm right-to-left midline shift from mass effect and minimal downward compression of the midbrain. Mild hydrocephalus. No downward herniation, effacement of basilar cisterns, or large vascular territory stroke. Vascular: No hyperdense vessel or unexpected calcification. Skull: Normal. Negative for fracture or focal lesion. Sinuses/Orbits: Severe left maxillary sinus mucosal thickening in chronic inflammatory changes of the walls of the sinus. Chronic lamina papyracea fracture on the right. Otherwise negative. Other: None. CT CERVICAL SPINE FINDINGS Alignment: Straightening of cervical lordosis.  No listhesis. Skull base and vertebrae: No acute fracture. No primary bone lesion or focal pathologic process. Soft tissues and spinal canal: No prevertebral fluid or swelling. No visible canal hematoma. Disc levels: Mild cervical spondylosis with discogenic degenerative changes at the C5-C7 levels where there is mild loss of intervertebral disc space height and anterior marginal osteophytes. No significant bony foraminal or canal stenosis. Upper chest: Negative. Other: Negative. IMPRESSION: 1. Acute hemorrhage centered in right thalamus, 21 cc. 2. Extension of hemorrhage into the ventricular system with large volume and right lateral ventricle and small volume within left lateral ventricle and third ventricle. 3. Mass effect with 5 mm right-to-left midline shift and minimal downward compression of the midbrain. 4. Mild hydrocephalus of lateral and third ventricles. 5. No acute fracture or dislocation of the cervical spine. These results were called by telephone at the time of interpretation on 04/29/2017 at 1:10 am to Dr. Otelia Limes, who verbally acknowledged these  results. Electronically Signed   By: Mitzi Hansen M.D.   On: 04/19/2017 01:13   Ct Head Code Stroke Wo Contrast  Result Date: 04/30/2017 CLINICAL DATA:  Code stroke. 51 y/o M; right-sided weakness and days. EXAM: CT HEAD WITHOUT CONTRAST CT CERVICAL SPINE WITHOUT CONTRAST TECHNIQUE: Multidetector CT imaging of the head and cervical spine was performed following the standard protocol without intravenous contrast. Multiplanar CT image reconstructions of the cervical spine were also generated. COMPARISON:  None. FINDINGS: CT HEAD FINDINGS Brain: Acute hemorrhage centered in right thalamus measuring 3.9 x 2.9 x 3.6 cm (volume = 21 cm^3). The hemorrhage has decompressed into the ventricular system with large volume of hemorrhage in right lateral ventricle and small volume of hemorrhage in the third ventricle as well as pooling in occipital horn of the left lateral ventricle. 5 mm right-to-left midline shift from mass effect and minimal downward compression of the midbrain. Mild hydrocephalus. No downward herniation, effacement of basilar cisterns, or large vascular territory stroke. Vascular: No hyperdense vessel or unexpected calcification. Skull: Normal. Negative for fracture or focal lesion. Sinuses/Orbits:  Severe left maxillary sinus mucosal thickening in chronic inflammatory changes of the walls of the sinus. Chronic lamina papyracea fracture on the right. Otherwise negative. Other: None. CT CERVICAL SPINE FINDINGS Alignment: Straightening of cervical lordosis.  No listhesis. Skull base and vertebrae: No acute fracture. No primary bone lesion or focal pathologic process. Soft tissues and spinal canal: No prevertebral fluid or swelling. No visible canal hematoma. Disc levels: Mild cervical spondylosis with discogenic degenerative changes at the C5-C7 levels where there is mild loss of intervertebral disc space height and anterior marginal osteophytes. No significant bony foraminal or canal stenosis.  Upper chest: Negative. Other: Negative. IMPRESSION: 1. Acute hemorrhage centered in right thalamus, 21 cc. 2. Extension of hemorrhage into the ventricular system with large volume and right lateral ventricle and small volume within left lateral ventricle and third ventricle. 3. Mass effect with 5 mm right-to-left midline shift and minimal downward compression of the midbrain. 4. Mild hydrocephalus of lateral and third ventricles. 5. No acute fracture or dislocation of the cervical spine. These results were called by telephone at the time of interpretation on Oct 22, 2016 at 1:10 am to Dr. Otelia LimesLindzen, who verbally acknowledged these results. Electronically Signed   By: Mitzi HansenLance  Furusawa-Stratton M.D.   On: Oct 22, 2016 01:13    Procedures Procedure Name: Intubation Date/Time: Oct 22, 2016 1:39 AM Performed by: Gilda CreasePollina, Samarrah Tranchina J, MD Pre-anesthesia Checklist: Patient identified, Emergency Drugs available, Suction available, Timeout performed and Patient being monitored Oxygen Delivery Method: Non-rebreather mask Preoxygenation: Pre-oxygenation with 100% oxygen Induction Type: Rapid sequence Ventilation: Mask ventilation without difficulty Laryngoscope Size: Glidescope and 4 Grade View: Grade I Tube size: 7.5 mm Number of attempts: 1 Placement Confirmation: ETT inserted through vocal cords under direct vision,  CO2 detector and Breath sounds checked- equal and bilateral Secured at: 24 cm Tube secured with: ETT holder Dental Injury: Teeth and Oropharynx as per pre-operative assessment  Future Recommendations: Recommend- induction with short-acting agent, and alternative techniques readily available      (including critical care time)  Medications Ordered in ED Medications  labetalol (NORMODYNE,TRANDATE) 5 MG/ML injection (not administered)   stroke: mapping our early stages of recovery book (not administered)  acetaminophen (TYLENOL) tablet 650 mg (not administered)    Or  acetaminophen  (TYLENOL) solution 650 mg (not administered)    Or  acetaminophen (TYLENOL) suppository 650 mg (not administered)  senna-docusate (Senokot-S) tablet 1 tablet (not administered)  pantoprazole (PROTONIX) injection 40 mg (not administered)  labetalol (NORMODYNE,TRANDATE) injection 20 mg (20 mg Intravenous Given 06-24-2016 0112)    And  clevidipine (CLEVIPREX) infusion 0.5 mg/mL (21 mg/hr Intravenous Rate/Dose Change 06-24-2016 0124)  fentaNYL (SUBLIMAZE) 100 MCG/2ML injection (not administered)  propofol (DIPRIVAN) 1000 MG/100ML infusion (45 mcg/kg/min  Rate/Dose Change 06-24-2016 0142)  fentaNYL (SUBLIMAZE) injection 100 mcg (50 mcg Intravenous Given 06-24-2016 0143)     Initial Impression / Assessment and Plan / ED Course  I have reviewed the triage vital signs and the nursing notes.  Pertinent labs & imaging results that were available during my care of the patient were reviewed by me and considered in my medical decision making (see chart for details).     Patient presented to the ER from home with left-sided deficit.  Patient noted to be very hypertensive on arrival.  Acute stroke and intracranial bleed felt to be likely.  Patient was awake and answering questions at arrival.  He was sent to radiology for CT.  CT did confirm intracerebral hemorrhage.  Upon return to the exam room from radiology  he was experiencing decreased alertness, not answering questions anymore.  It was therefore felt that the patient required intubation for airway protection.  This was performed without difficulty.  Patient initiated on Cleviprex for blood pressure control.  He was placed on propofol for sedation.  Patient will be admitted by the neurologist to the ICU.  I have Consulted critical care for ventilator management.  CRITICAL CARE Performed by: Gilda CreasePOLLINA, Sherion Dooly J.   Total critical care time: 35 minutes  Critical care time was exclusive of separately billable procedures and treating other  patients.  Critical care was necessary to treat or prevent imminent or life-threatening deterioration.  Critical care was time spent personally by me on the following activities: development of treatment plan with patient and/or surrogate as well as nursing, discussions with consultants, evaluation of patient's response to treatment, examination of patient, obtaining history from patient or surrogate, ordering and performing treatments and interventions, ordering and review of laboratory studies, ordering and review of radiographic studies, pulse oximetry and re-evaluation of patient's condition.   Final Clinical Impressions(s) / ED Diagnoses   Final diagnoses:  Right-sided nontraumatic intracerebral hemorrhage, unspecified cerebral location [facility](HCC)    ED Discharge Orders    None       Blinda LeatherwoodPollina, Canary Brimhristopher J, MD Feb 23, 2017 [phone]

## 2017-04-22 NOTE — Consult Note (Signed)
PULMONARY / CRITICAL CARE MEDICINE   Name: Joseph Rubio MRN: [id number] DOB: 1965/12/12    ADMISSION DATE:  05/10/2017 CONSULTATION DATE:  04/12/2017  REFERRING MD:  Dr. Otelia LimesLindzen   CHIEF COMPLAINT:  CVA  HISTORY OF PRESENT ILLNESS: Information obtained from medical record as patient is intubated and sedated  51 year old male with PMH of Asthma   Presents to ED on 11/12 after being found down in shower, last spoke to family at 2300. Upon arrival to ED patient verbally responsive with left side weakness and right gaze deviation. While in CT scanner patient became altered. Requiring intubation. CT Head with large posterior basal ganglia and thalamus hemorrhage on the right with intraventricular extension and mass effect upon the right half of the midbrain. PCCM asked to consult for vent management.    PAST MEDICAL HISTORY :  He  has no past medical history on file.  PAST SURGICAL HISTORY: He  has no past surgical history on file.  Allergies not on file  No current facility-administered medications on file prior to encounter.    No current outpatient medications on file prior to encounter.    FAMILY HISTORY:  His has no family status information on file.    SOCIAL HISTORY: He    REVIEW OF SYSTEMS:   Unable to review as patient is intubated and sedated   SUBJECTIVE:   VITAL SIGNS: BP (!) 137/94   Pulse 94   Resp 18   Wt 73.2 kg (161 lb 6 oz)   SpO2 100%   HEMODYNAMICS:    VENTILATOR SETTINGS: Vent Mode: PRVC FiO2 (%):  [100 %] 100 % Set Rate:  [16 bmp] 16 bmp Vt Set:  [500 mL] 500 mL PEEP:  [5 cmH20] 5 cmH20  INTAKE / OUTPUT: No intake/output data recorded.  PHYSICAL EXAMINATION: General:  Adult male, on vent  Neuro:  Sedated, does not move left upper/lower extremity, withdrawals on right, left facial droop    HEENT:  ETT in place  Cardiovascular:  Tachy, no MRG  Lungs:  Clear breath sounds, no wheeze/crackles  Abdomen:  Non-distended, active bowel sounds   Musculoskeletal:  -edema  Skin:  Warm, dry, intact   LABS:  BMET Recent Labs  Lab 04/24/2017 0040 05/07/2017 0055  NA 137 140  K 3.1* 3.2*  CL 107 109  CO2 14*  --   BUN 11 13  CREATININE 0.99 1.00  GLUCOSE 125* 123*    Electrolytes Recent Labs  Lab 05/02/2017 0040  CALCIUM 8.5*    CBC Recent Labs  Lab 04/12/2017 0040 05/03/2017 0055  WBC 9.0  --   HGB 12.0* 14.3  HCT 36.7* 42.0  PLT 313  --     Coag's Recent Labs  Lab 04/30/2017 0040  APTT 28  INR 1.23    Sepsis Markers No results for input(s): LATICACIDVEN, PROCALCITON, O2SATVEN in the last 168 hours.  ABG No results for input(s): PHART, PCO2ART, PO2ART in the last 168 hours.  Liver Enzymes Recent Labs  Lab 04/25/2017 0040  AST 36  ALT 25  ALKPHOS 74  BILITOT 0.7  ALBUMIN 3.9    Cardiac Enzymes No results for input(s): TROPONINI, PROBNP in the last 168 hours.  Glucose Recent Labs  Lab 04/17/2017 0043  GLUCAP 116*    Imaging Ct Cervical Spine Wo Contrast  Result Date: 04/12/2017 CLINICAL DATA:  Code stroke. 51 y/o M; right-sided weakness and days. EXAM: CT HEAD WITHOUT CONTRAST CT CERVICAL SPINE WITHOUT CONTRAST TECHNIQUE: Multidetector CT imaging of  the head and cervical spine was performed following the standard protocol without intravenous contrast. Multiplanar CT image reconstructions of the cervical spine were also generated. COMPARISON:  None. FINDINGS: CT HEAD FINDINGS Brain: Acute hemorrhage centered in right thalamus measuring 3.9 x 2.9 x 3.6 cm (volume = 21 cm^3). The hemorrhage has decompressed into the ventricular system with large volume of hemorrhage in right lateral ventricle and small volume of hemorrhage in the third ventricle as well as pooling in occipital horn of the left lateral ventricle. 5 mm right-to-left midline shift from mass effect and minimal downward compression of the midbrain. Mild hydrocephalus. No downward herniation, effacement of basilar cisterns, or large vascular  territory stroke. Vascular: No hyperdense vessel or unexpected calcification. Skull: Normal. Negative for fracture or focal lesion. Sinuses/Orbits: Severe left maxillary sinus mucosal thickening in chronic inflammatory changes of the walls of the sinus. Chronic lamina papyracea fracture on the right. Otherwise negative. Other: None. CT CERVICAL SPINE FINDINGS Alignment: Straightening of cervical lordosis.  No listhesis. Skull base and vertebrae: No acute fracture. No primary bone lesion or focal pathologic process. Soft tissues and spinal canal: No prevertebral fluid or swelling. No visible canal hematoma. Disc levels: Mild cervical spondylosis with discogenic degenerative changes at the C5-C7 levels where there is mild loss of intervertebral disc space height and anterior marginal osteophytes. No significant bony foraminal or canal stenosis. Upper chest: Negative. Other: Negative. IMPRESSION: 1. Acute hemorrhage centered in right thalamus, 21 cc. 2. Extension of hemorrhage into the ventricular system with large volume and right lateral ventricle and small volume within left lateral ventricle and third ventricle. 3. Mass effect with 5 mm right-to-left midline shift and minimal downward compression of the midbrain. 4. Mild hydrocephalus of lateral and third ventricles. 5. No acute fracture or dislocation of the cervical spine. These results were called by telephone at the time of interpretation on 04/21/2017 at 1:10 am to Dr. Otelia LimesLindzen, who verbally acknowledged these results. Electronically Signed   By: Mitzi HansenLance  Furusawa-Stratton M.D.   On: 04/19/2017 01:13   Ct Head Code Stroke Wo Contrast  Result Date: 04/30/2017 CLINICAL DATA:  Code stroke. 51 y/o M; right-sided weakness and days. EXAM: CT HEAD WITHOUT CONTRAST CT CERVICAL SPINE WITHOUT CONTRAST TECHNIQUE: Multidetector CT imaging of the head and cervical spine was performed following the standard protocol without intravenous contrast. Multiplanar CT image  reconstructions of the cervical spine were also generated. COMPARISON:  None. FINDINGS: CT HEAD FINDINGS Brain: Acute hemorrhage centered in right thalamus measuring 3.9 x 2.9 x 3.6 cm (volume = 21 cm^3). The hemorrhage has decompressed into the ventricular system with large volume of hemorrhage in right lateral ventricle and small volume of hemorrhage in the third ventricle as well as pooling in occipital horn of the left lateral ventricle. 5 mm right-to-left midline shift from mass effect and minimal downward compression of the midbrain. Mild hydrocephalus. No downward herniation, effacement of basilar cisterns, or large vascular territory stroke. Vascular: No hyperdense vessel or unexpected calcification. Skull: Normal. Negative for fracture or focal lesion. Sinuses/Orbits: Severe left maxillary sinus mucosal thickening in chronic inflammatory changes of the walls of the sinus. Chronic lamina papyracea fracture on the right. Otherwise negative. Other: None. CT CERVICAL SPINE FINDINGS Alignment: Straightening of cervical lordosis.  No listhesis. Skull base and vertebrae: No acute fracture. No primary bone lesion or focal pathologic process. Soft tissues and spinal canal: No prevertebral fluid or swelling. No visible canal hematoma. Disc levels: Mild cervical spondylosis with discogenic degenerative changes at the  C5-C7 levels where there is mild loss of intervertebral disc space height and anterior marginal osteophytes. No significant bony foraminal or canal stenosis. Upper chest: Negative. Other: Negative. IMPRESSION: 1. Acute hemorrhage centered in right thalamus, 21 cc. 2. Extension of hemorrhage into the ventricular system with large volume and right lateral ventricle and small volume within left lateral ventricle and third ventricle. 3. Mass effect with 5 mm right-to-left midline shift and minimal downward compression of the midbrain. 4. Mild hydrocephalus of lateral and third ventricles. 5. No acute fracture  or dislocation of the cervical spine. These results were called by telephone at the time of interpretation on Apr 29, 2017 at 1:10 am to Dr. Otelia Limes, who verbally acknowledged these results. Electronically Signed   By: Mitzi Hansen M.D.   On: 04-29-17 01:13     STUDIES:  CT Head 11/12 > 1. Acute hemorrhage centered in right thalamus, 21 cc. 2. Extension of hemorrhage into the ventricular system with large volume and right lateral ventricle and small volume within left lateral ventricle and third ventricle. 3. Mass effect with 5 mm right-to-left midline shift and minimal downward compression of the midbrain. 4. Mild hydrocephalus of lateral and third ventricles. CT C Spine 11/12 >  No acute fracture or dislocation of the cervical spine. CXR 11/12 > Endotracheal tube with tip appearing above the carina. Enteric tube extends into the left hemiabdomen with tip beyond the inferior margin of the image.  CULTURES: None.   ANTIBIOTICS: None.   SIGNIFICANT EVENTS: 11/12 > Presents to ED   LINES/TUBES: ETT 11/12 >>  DISCUSSION: 51 year old male presents to ED with left sided weakness and right gaze deviation. CT Head with right thalamus hemorrhage with extension in the the ventricular system   ASSESSMENT / PLAN:  Respiratory Insufficiency in setting of Acute CVA H/O Asthma  Plan  -Vent Support -Mental Status barrier to extubation  -Trend ABG/CXR  HTN Urgency  BP 209/110 in ED  Plan  -Cardiac Monitoring  -Wean Cleviprex to achieve systolic 100-140  Hypokalemia  Plan  -Trend BMP -Replace electrolytes as indicated   Acute Hemorrhage -INR 15.4, APTT 28 Plan  -Trend CBC  -Hold all anticoagulation -SCDs only    Acute Encephalopathy secondary to Acute CVA  Right Thalamus Acute Hemorrhage with extension into the ventricular system with 5 mm right to left midline shift  ETOH Level 40  Plan  -Per Neurology  -Neurosurgery Consulted -Frequent Neuro Checks    -MRI/MRA pending  -Currently on Hypertonic Saline > Trend Sodium q6h  -RASS goal 0/-1  -Wean Propofol gtt to achieve RASS -PRN Fentanyl   FAMILY  - Updates: no family at bedside   - Inter-disciplinary family meet or Palliative Care meeting due by: 04/29/2017    CC Time: 45 minutes   Jovita Kussmaul, AGACNP-BC [facility]  Pgr: [phone]  PCCM Pgr: [phone]

## 2017-04-22 NOTE — Consult Note (Signed)
Reason for Consult:intracranial hemorrhage Referring Physician: Yul, Diana is an 51 y.o. male.  HPI: whom sustained a cerebral hemorrhage earlier this morning. He has been lethargic at times but has followed commands. Noted to be plegic on his left side.   No past medical history on file.  No past surgical history on file.  No family history on file.  Social History:  reports that he has been smoking.  He does not have any smokeless tobacco history on file. He reports that he drinks alcohol. His drug history is not on file.  Allergies: Not on File  Medications: I have reviewed the patient's current medications.  Results for orders placed or performed during the hospital encounter of 04/11/2017 (from the past 48 hour(s))  Protime-INR     Status: Abnormal   Collection Time: 04/19/2017 12:40 AM  Result Value Ref Range   Prothrombin Time 15.4 (H) 11.4 - 15.2 seconds   INR 1.23   APTT     Status: None   Collection Time: 05/09/2017 12:40 AM  Result Value Ref Range   aPTT 28 24 - 36 seconds  CBC     Status: Abnormal   Collection Time: 05/08/2017 12:40 AM  Result Value Ref Range   WBC 9.0 4.0 - 10.5 K/uL   RBC 4.02 (L) 4.22 - 5.81 MIL/uL   Hemoglobin 12.0 (L) 13.0 - 17.0 g/dL   HCT 36.7 (L) 39.0 - 52.0 %   MCV 91.3 78.0 - 100.0 fL   MCH 29.9 26.0 - 34.0 pg   MCHC 32.7 30.0 - 36.0 g/dL   RDW 15.3 11.5 - 15.5 %   Platelets 313 150 - 400 K/uL  Differential     Status: None   Collection Time: 04/19/2017 12:40 AM  Result Value Ref Range   Neutrophils Relative % 55 %   Neutro Abs 5.0 1.7 - 7.7 K/uL   Lymphocytes Relative 37 %   Lymphs Abs 3.3 0.7 - 4.0 K/uL   Monocytes Relative 6 %   Monocytes Absolute 0.5 0.1 - 1.0 K/uL   Eosinophils Relative 1 %   Eosinophils Absolute 0.1 0.0 - 0.7 K/uL   Basophils Relative 1 %   Basophils Absolute 0.1 0.0 - 0.1 K/uL  Comprehensive metabolic panel     Status: Abnormal   Collection Time: 04/28/2017 12:40 AM  Result Value Ref Range    Sodium 137 135 - 145 mmol/L   Potassium 3.1 (L) 3.5 - 5.1 mmol/L   Chloride 107 101 - 111 mmol/L   CO2 14 (L) 22 - 32 mmol/L   Glucose, Bld 125 (H) 65 - 99 mg/dL   BUN 11 6 - 20 mg/dL   Creatinine, Ser 0.99 0.61 - 1.24 mg/dL   Calcium 8.5 (L) 8.9 - 10.3 mg/dL   Total Protein 7.9 6.5 - 8.1 g/dL   Albumin 3.9 3.5 - 5.0 g/dL   AST 36 15 - 41 U/L   ALT 25 17 - 63 U/L   Alkaline Phosphatase 74 38 - 126 U/L   Total Bilirubin 0.7 0.3 - 1.2 mg/dL   GFR calc non Af Amer >60 >60 mL/min   GFR calc Af Amer >60 >60 mL/min    Comment: (NOTE) The eGFR has been calculated using the CKD EPI equation. This calculation has not been validated in all clinical situations. eGFR's persistently <60 mL/min signify possible Chronic Kidney Disease.    Anion gap 16 (H) 5 - 15  CBG monitoring, ED  Status: Abnormal   Collection Time: 05/10/2017 12:43 AM  Result Value Ref Range   Glucose-Capillary 116 (H) 65 - 99 mg/dL  I-Stat Chem 8, ED     Status: Abnormal   Collection Time: 04/20/2017 12:55 AM  Result Value Ref Range   Sodium 140 135 - 145 mmol/L   Potassium 3.2 (L) 3.5 - 5.1 mmol/L   Chloride 109 101 - 111 mmol/L   BUN 13 6 - 20 mg/dL   Creatinine, Ser 1.00 0.61 - 1.24 mg/dL   Glucose, Bld 123 (H) 65 - 99 mg/dL   Calcium, Ion 1.14 (L) 1.15 - 1.40 mmol/L   TCO2 14 (L) 22 - 32 mmol/L   Hemoglobin 14.3 13.0 - 17.0 g/dL   HCT 42.0 39.0 - 52.0 %  I-stat troponin, ED     Status: None   Collection Time: 05/05/2017  1:00 AM  Result Value Ref Range   Troponin i, poc 0.00 0.00 - 0.08 ng/mL   Comment 3            Comment: Due to the release kinetics of cTnI, a negative result within the first hours of the onset of symptoms does not rule out myocardial infarction with certainty. If myocardial infarction is still suspected, repeat the test at appropriate intervals.   Hemoglobin A1c     Status: None   Collection Time: 04/12/2017  1:28 AM  Result Value Ref Range   Hgb A1c MFr Bld 4.9 4.8 - 5.6 %    Comment:  (NOTE) Pre diabetes:          5.7%-6.4% Diabetes:              >6.4% Glycemic control for   <7.0% adults with diabetes    Mean Plasma Glucose 93.93 mg/dL  HIV antibody (Routine Testing)     Status: None   Collection Time: 04/20/2017  1:33 AM  Result Value Ref Range   HIV Screen 4th Generation wRfx Non Reactive Non Reactive    Comment: (NOTE) Performed At: [facility] [place], [place] [id number] Rush Farmer MD FK:[id number]   Ethanol     Status: Abnormal   Collection Time: 04/17/2017  1:33 AM  Result Value Ref Range   Alcohol, Ethyl (B) 40 (H) <10 mg/dL    Comment:        LOWEST DETECTABLE LIMIT FOR SERUM ALCOHOL IS 10 mg/dL FOR MEDICAL PURPOSES ONLY   Glucose, capillary     Status: Abnormal   Collection Time: 04/13/2017  1:50 AM  Result Value Ref Range   Glucose-Capillary 129 (H) 65 - 99 mg/dL   Comment 1 Notify RN    Comment 2 Document in Chart   Sodium     Status: None   Collection Time: 04/29/2017  2:17 AM  Result Value Ref Range   Sodium 136 135 - 145 mmol/L  MRSA PCR Screening     Status: None   Collection Time: 04/30/2017  2:25 AM  Result Value Ref Range   MRSA by PCR NEGATIVE NEGATIVE    Comment:        The GeneXpert MRSA Assay (FDA approved for NASAL specimens only), is one component of a comprehensive MRSA colonization surveillance program. It is not intended to diagnose MRSA infection nor to guide or monitor treatment for MRSA infections.   Blood gas, arterial     Status: Abnormal   Collection Time: 04/24/2017  2:35 AM  Result Value Ref Range   FIO2 40.00  Delivery systems VENTILATOR    Mode PRESSURE REGULATED VOLUME CONTROL    VT 500 mL   LHR 16 resp/min   Peep/cpap 5.0 cm H20   pH, Arterial 7.329 (L) 7.350 - 7.450   pCO2 arterial 29.3 (L) 32.0 - 48.0 mmHg   pO2, Arterial 375 (H) 83.0 - 108.0 mmHg   Bicarbonate 15.0 (L) 20.0 - 28.0 mmol/L   Acid-base deficit 9.8 (H) 0.0 - 2.0 mmol/L   O2 Saturation 99.7 %   Patient  temperature 98.6    Collection site LEFT RADIAL    Drawn by [phone]    Sample type ARTERIAL DRAW    Allens test (pass/fail) PASS PASS  Glucose, capillary     Status: Abnormal   Collection Time: 04/20/2017  4:03 AM  Result Value Ref Range   Glucose-Capillary 114 (H) 65 - 99 mg/dL  Urine rapid drug screen (hosp performed)not at [facility]     Status: Abnormal   Collection Time: 04/25/2017  6:15 AM  Result Value Ref Range   Opiates NONE DETECTED NONE DETECTED   Cocaine POSITIVE (A) NONE DETECTED   Benzodiazepines NONE DETECTED NONE DETECTED   Amphetamines NONE DETECTED NONE DETECTED   Tetrahydrocannabinol POSITIVE (A) NONE DETECTED   Barbiturates NONE DETECTED NONE DETECTED    Comment:        DRUG SCREEN FOR MEDICAL PURPOSES ONLY.  IF CONFIRMATION IS NEEDED FOR ANY PURPOSE, NOTIFY LAB WITHIN 5 DAYS.        LOWEST DETECTABLE LIMITS FOR URINE DRUG SCREEN Drug Class       Cutoff (ng/mL) Amphetamine      1000 Barbiturate      200 Benzodiazepine   165 Tricyclics       790 Opiates          300 Cocaine          300 THC              50   Sodium     Status: None   Collection Time: 04/24/2017  7:30 AM  Result Value Ref Range   Sodium 138 135 - 145 mmol/L    Comment: POST-ULTRACENTRIFUGATION  Triglycerides     Status: Abnormal   Collection Time: 05/10/2017  7:30 AM  Result Value Ref Range   Triglycerides 1,231 (H) <150 mg/dL    Comment: RESULTS CONFIRMED BY MANUAL DILUTION  Glucose, capillary     Status: Abnormal   Collection Time: 05/02/2017  8:10 AM  Result Value Ref Range   Glucose-Capillary 112 (H) 65 - 99 mg/dL   Comment 1 Notify RN    Comment 2 Document in Chart   Glucose, capillary     Status: Abnormal   Collection Time: 04/15/2017 11:31 AM  Result Value Ref Range   Glucose-Capillary 128 (H) 65 - 99 mg/dL   Comment 1 Notify RN    Comment 2 Document in Chart   Sodium     Status: None   Collection Time: 04/30/2017  2:09 PM  Result Value Ref Range   Sodium 138 135 - 145 mmol/L  Glucose,  capillary     Status: Abnormal   Collection Time: 04/27/2017  3:56 PM  Result Value Ref Range   Glucose-Capillary 135 (H) 65 - 99 mg/dL   Comment 1 Notify RN    Comment 2 Document in Chart   Magnesium     Status: None   Collection Time: 05/09/2017  4:41 PM  Result Value Ref Range   Magnesium 2.0 1.7 -  2.4 mg/dL  Phosphorus     Status: None   Collection Time: 05/04/2017  4:41 PM  Result Value Ref Range   Phosphorus 3.4 2.5 - 4.6 mg/dL  Glucose, capillary     Status: Abnormal   Collection Time: 04/28/2017  7:29 PM  Result Value Ref Range   Glucose-Capillary 115 (H) 65 - 99 mg/dL   Comment 1 Notify RN    Comment 2 Document in Chart     Ct Head Wo Contrast  Result Date: 05/04/2017 CLINICAL DATA:  Follow-up intracranial hemorrhage 04/18/2017 EXAM: CT HEAD WITHOUT CONTRAST TECHNIQUE: Contiguous axial images were obtained from the base of the skull through the vertex without intravenous contrast. COMPARISON:  None. FINDINGS: Brain: Intraparenchymal hematoma centered in the right thalamus measures 4.5 x 2.8 x 3.7 cm (volume = 24 cm^3), previously 2.8 x 4.4 x 3.6 cm (volume = 23 cm^3). The amount of blood in the lateral ventricles is unchanged. Right temporal horn remains mildly dilated, but not increased from the prior study. The configuration of the ventricles is unchanged. There are no new areas of hemorrhage. Rule mm leftward midline shift at the foramina Monro is unchanged. Vascular: No hyperdense vessel or unexpected calcification. Skull: Normal. Negative for fracture or focal lesion. Sinuses/Orbits: Severe left maxillary mucosal disease, unchanged. Other: None. IMPRESSION: Unchanged intraparenchymal hematoma centered in the left thalamus with associated intraventricular extension and 3 mm of leftward midline shift. No new hemorrhage. Unchanged size and configuration of the ventricles. Electronically Signed   By: Ulyses Jarred M.D.   On: 05/07/2017 19:38   Ct Head Wo Contrast  Result Date:  04/15/2017 CLINICAL DATA:  Follow-up intracranial hemorrhage. EXAM: CT HEAD WITHOUT CONTRAST TECHNIQUE: Contiguous axial images were obtained from the base of the skull through the vertex without intravenous contrast. COMPARISON:  CT HEAD April 22, 2017 at 0054 hours FINDINGS: BRAIN: 2.8 x 4.4 x 3.6 cm (volume = 23 cc, previously 21 cc) RIGHT thalamic hematoma is slightly larger with surrounding low-density vasogenic edema. Again noted is intraventricular extension with mild hydrocephalus. 3 mm RIGHT to LEFT midline shift is similar. Narrowed though patent basal cisterns. 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 No acute large vascular territory infarcts. Minimal white matter hypodensities compatible with mild chronic small vessel ischemic disease. No abnormal extra-axial fluid collections. VASCULAR: Unremarkable. SKULL/SOFT TISSUES: No skull fracture. No significant soft tissue swelling. ORBITS/SINUSES: The included ocular globes and orbital contents are normal.The mastoid aircells and included paranasal sinuses are well-aerated. OTHER: None. IMPRESSION: 1. Evolving acute 2.8 x 4.4 x 3.6 cm RIGHT thalamus intraparenchymal hematoma. 2. Similar intraventricular extension of hemorrhage and mild hydrocephalus. 3 mm RIGHT to LEFT midline shift. 3. 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 Electronically Signed   By: Elon Alas M.D.   On: 04/23/2017 03:24   Ct Cervical Spine Wo Contrast  Result Date: 05/09/2017 CLINICAL DATA:  Code stroke. 51 y/o M; right-sided weakness and days. EXAM: CT HEAD WITHOUT CONTRAST CT CERVICAL SPINE WITHOUT CONTRAST TECHNIQUE: Multidetector CT imaging of the head and cervical spine was performed following the standard protocol without intravenous contrast. Multiplanar CT image reconstructions of the cervical spine were also generated. COMPARISON:  None. FINDINGS: CT HEAD FINDINGS Brain: Acute hemorrhage centered in right thalamus measuring 3.9  x 2.9 x 3.6 cm (volume = 21 cm^3). The hemorrhage has decompressed into the ventricular system with large volume of hemorrhage in right lateral ventricle and small volume of hemorrhage in the third ventricle as well  as pooling in occipital horn of the left lateral ventricle. 5 mm right-to-left midline shift from mass effect and minimal downward compression of the midbrain. Mild hydrocephalus. No downward herniation, effacement of basilar cisterns, or large vascular territory stroke. Vascular: No hyperdense vessel or unexpected calcification. Skull: Normal. Negative for fracture or focal lesion. Sinuses/Orbits: Severe left maxillary sinus mucosal thickening in chronic inflammatory changes of the walls of the sinus. Chronic lamina papyracea fracture on the right. Otherwise negative. Other: None. CT CERVICAL SPINE FINDINGS Alignment: Straightening of cervical lordosis.  No listhesis. Skull base and vertebrae: No acute fracture. No primary bone lesion or focal pathologic process. Soft tissues and spinal canal: No prevertebral fluid or swelling. No visible canal hematoma. Disc levels: Mild cervical spondylosis with discogenic degenerative changes at the C5-C7 levels where there is mild loss of intervertebral disc space height and anterior marginal osteophytes. No significant bony foraminal or canal stenosis. Upper chest: Negative. Other: Negative. IMPRESSION: 1. Acute hemorrhage centered in right thalamus, 21 cc. 2. Extension of hemorrhage into the ventricular system with large volume and right lateral ventricle and small volume within left lateral ventricle and third ventricle. 3. Mass effect with 5 mm right-to-left midline shift and minimal downward compression of the midbrain. 4. Mild hydrocephalus of lateral and third ventricles. 5. No acute fracture or dislocation of the cervical spine. These results were called by telephone at the time of interpretation on 04/30/2017 at 1:10 am to Dr. Cheral Marker, who verbally  acknowledged these results. Electronically Signed   By: Kristine Garbe M.D.   On: 04/20/2017 01:13   Dg Chest Portable 1 View  Result Date: 04/29/2017 CLINICAL DATA:  51 year old male with acute intracranial hemorrhage status post intubation. EXAM: PORTABLE CHEST 1 VIEW COMPARISON:  CT of the head and neck dated 05/05/2017 FINDINGS: An endotracheal tube is noted with tip projecting at the level of the right fourth costovertebral junction. The carina is poorly visualized, however the tip of the tube is likely approximately 3 cm above the carina. An enteric tube extends into the left hemiabdomen with tip beyond the inferior margin of the image. The lungs are clear. There is no pleural effusion or pneumothorax. The cardiac silhouette is within normal limits. No acute osseous pathology. IMPRESSION: Endotracheal tube with tip appearing above the carina. Enteric tube extends into the left hemiabdomen with tip beyond the inferior margin of the image. Electronically Signed   By: Anner Crete M.D.   On: 05/09/2017 01:51   Dg Abd Portable 1v  Result Date: 04/21/2017 CLINICAL DATA:  Orogastric tube placement. EXAM: PORTABLE ABDOMEN - 1 VIEW COMPARISON:  None. FINDINGS: Orogastric tube tip projects in proximal stomach, side port past GE junction. Included bowel gas pattern is nondilated and nonobstructive. Lung bases are clear. Soft tissue planes and included osseous structures are normal. IMPRESSION: Orogastric tube tip projects in proximal stomach. Electronically Signed   By: Elon Alas M.D.   On: 04/30/2017 05:39   Ct Head Code Stroke Wo Contrast  Result Date: 04/23/2017 CLINICAL DATA:  Code stroke. 51 y/o M; right-sided weakness and days. EXAM: CT HEAD WITHOUT CONTRAST CT CERVICAL SPINE WITHOUT CONTRAST TECHNIQUE: Multidetector CT imaging of the head and cervical spine was performed following the standard protocol without intravenous contrast. Multiplanar CT image reconstructions of the  cervical spine were also generated. COMPARISON:  None. FINDINGS: CT HEAD FINDINGS Brain: Acute hemorrhage centered in right thalamus measuring 3.9 x 2.9 x 3.6 cm (volume = 21 cm^3). The hemorrhage has decompressed into the  ventricular system with large volume of hemorrhage in right lateral ventricle and small volume of hemorrhage in the third ventricle as well as pooling in occipital horn of the left lateral ventricle. 5 mm right-to-left midline shift from mass effect and minimal downward compression of the midbrain. Mild hydrocephalus. No downward herniation, effacement of basilar cisterns, or large vascular territory stroke. Vascular: No hyperdense vessel or unexpected calcification. Skull: Normal. Negative for fracture or focal lesion. Sinuses/Orbits: Severe left maxillary sinus mucosal thickening in chronic inflammatory changes of the walls of the sinus. Chronic lamina papyracea fracture on the right. Otherwise negative. Other: None. CT CERVICAL SPINE FINDINGS Alignment: Straightening of cervical lordosis.  No listhesis. Skull base and vertebrae: No acute fracture. No primary bone lesion or focal pathologic process. Soft tissues and spinal canal: No prevertebral fluid or swelling. No visible canal hematoma. Disc levels: Mild cervical spondylosis with discogenic degenerative changes at the C5-C7 levels where there is mild loss of intervertebral disc space height and anterior marginal osteophytes. No significant bony foraminal or canal stenosis. Upper chest: Negative. Other: Negative. IMPRESSION: 1. Acute hemorrhage centered in right thalamus, 21 cc. 2. Extension of hemorrhage into the ventricular system with large volume and right lateral ventricle and small volume within left lateral ventricle and third ventricle. 3. Mass effect with 5 mm right-to-left midline shift and minimal downward compression of the midbrain. 4. Mild hydrocephalus of lateral and third ventricles. 5. No acute fracture or dislocation of the  cervical spine. These results were called by telephone at the time of interpretation on 05/06/2017 at 1:10 am to Dr. Cheral Marker, who verbally acknowledged these results. Electronically Signed   By: Kristine Garbe M.D.   On: 05/03/2017 01:13    Review of Systems  Unable to perform ROS: Patient nonverbal   Blood pressure 119/68, pulse 72, temperature 98.7 F (37.1 C), temperature source Axillary, resp. rate 16, height '5\' 8"'$  (1.727 m), weight 74.2 kg (163 lb 9.3 oz), SpO2 100 %. Physical Exam  Constitutional: He appears well-developed and well-nourished.  HENT:  Head: Normocephalic and atraumatic.  Eyes: Pupils are equal, round, and reactive to light.  Pupils small minimally reactive  Cardiovascular: Normal rate and regular rhythm.  Respiratory:  Intubated, on ventilator  GI: Soft.  Musculoskeletal:  Cannot assess  Neurological:  Intubated, sedated +corneals, +cough Purposeful on right side Unable to fully assess motor, sensation, language Not following commands  Skin: Skin is warm and dry.  Psychiatric:  Cannot assess    Assessment/Plan: Mr. Duley has had three ct scans within 24 hrs. There has been no change in the hemorrhage, or his ventricles. I do not believe at this time that ventricular drainage is needed. He remains plegic on his left side. Now intubated, and sedated Will continue to follow.  Willer Osorno L 05/04/2017, 8:23 PM

## 2017-04-23 ENCOUNTER — Inpatient Hospital Stay (HOSPITAL_COMMUNITY): Payer: Medicaid Other

## 2017-04-23 ENCOUNTER — Other Ambulatory Visit: Payer: Self-pay

## 2017-04-23 DIAGNOSIS — G935 Compression of brain: Secondary | ICD-10-CM

## 2017-04-23 DIAGNOSIS — G9382 Brain death: Secondary | ICD-10-CM

## 2017-04-23 DIAGNOSIS — J9601 Acute respiratory failure with hypoxia: Secondary | ICD-10-CM

## 2017-04-23 DIAGNOSIS — I615 Nontraumatic intracerebral hemorrhage, intraventricular: Secondary | ICD-10-CM

## 2017-04-23 DIAGNOSIS — J939 Pneumothorax, unspecified: Secondary | ICD-10-CM

## 2017-04-23 DIAGNOSIS — G936 Cerebral edema: Secondary | ICD-10-CM

## 2017-04-23 LAB — BASIC METABOLIC PANEL
ANION GAP: 5 (ref 5–15)
BUN: 9 mg/dL (ref 6–20)
CALCIUM: 8 mg/dL — AB (ref 8.9–10.3)
CO2: 19 mmol/L — ABNORMAL LOW (ref 22–32)
Chloride: 119 mmol/L — ABNORMAL HIGH (ref 101–111)
Creatinine, Ser: 1.11 mg/dL (ref 0.61–1.24)
GLUCOSE: 124 mg/dL — AB (ref 65–99)
POTASSIUM: 3.6 mmol/L (ref 3.5–5.1)
SODIUM: 143 mmol/L (ref 135–145)

## 2017-04-23 LAB — GLUCOSE, CAPILLARY
GLUCOSE-CAPILLARY: 145 mg/dL — AB (ref 65–99)
GLUCOSE-CAPILLARY: 140 mg/dL — AB (ref 65–99)
Glucose-Capillary: 133 mg/dL — ABNORMAL HIGH (ref 65–99)
Glucose-Capillary: 129 mg/dL — ABNORMAL HIGH (ref 65–99)
Glucose-Capillary: 149 mg/dL — ABNORMAL HIGH (ref 65–99)
Glucose-Capillary: 162 mg/dL — ABNORMAL HIGH (ref 65–99)

## 2017-04-23 LAB — PHOSPHORUS
PHOSPHORUS: 3.5 mg/dL (ref 2.5–4.6)
Phosphorus: 2.6 mg/dL (ref 2.5–4.6)

## 2017-04-23 LAB — BLOOD GAS, ARTERIAL
ACID-BASE DEFICIT: 4.5 mmol/L — AB (ref 0.0–2.0)
BICARBONATE: 20.3 mmol/L (ref 20.0–28.0)
DRAWN BY: 44135
FIO2: 40
MECHVT: 500 mL
O2 SAT: 98.5 %
PCO2 ART: 40.4 mmHg (ref 32.0–48.0)
PEEP: 5 cmH2O
Patient temperature: 99.9
RATE: 16 resp/min
pH, Arterial: 7.327 — ABNORMAL LOW (ref 7.350–7.450)
pO2, Arterial: 122 mmHg — ABNORMAL HIGH (ref 83.0–108.0)

## 2017-04-23 LAB — CBC WITH DIFFERENTIAL/PLATELET
BASOS ABS: 0 10*3/uL (ref 0.0–0.1)
BASOS PCT: 0 %
Eosinophils Absolute: 0 10*3/uL (ref 0.0–0.7)
Eosinophils Relative: 0 %
HEMATOCRIT: 28.7 % — AB (ref 39.0–52.0)
HEMOGLOBIN: 9 g/dL — AB (ref 13.0–17.0)
LYMPHS PCT: 16 %
Lymphs Abs: 1.4 10*3/uL (ref 0.7–4.0)
MCH: 29.3 pg (ref 26.0–34.0)
MCHC: 31.4 g/dL (ref 30.0–36.0)
MCV: 93.5 fL (ref 78.0–100.0)
Monocytes Absolute: 1.1 10*3/uL — ABNORMAL HIGH (ref 0.1–1.0)
Monocytes Relative: 13 %
NEUTROS ABS: 6.1 10*3/uL (ref 1.7–7.7)
NEUTROS PCT: 71 %
Platelets: 271 10*3/uL (ref 150–400)
RBC: 3.07 MIL/uL — ABNORMAL LOW (ref 4.22–5.81)
RDW: 15.6 % — AB (ref 11.5–15.5)
WBC: 8.6 10*3/uL (ref 4.0–10.5)

## 2017-04-23 LAB — SODIUM
SODIUM: 144 mmol/L (ref 135–145)
Sodium: 146 mmol/L — ABNORMAL HIGH (ref 135–145)
Sodium: 147 mmol/L — ABNORMAL HIGH (ref 135–145)
Sodium: 147 mmol/L — ABNORMAL HIGH (ref 135–145)

## 2017-04-23 LAB — MAGNESIUM
MAGNESIUM: 2.2 mg/dL (ref 1.7–2.4)
Magnesium: 2.2 mg/dL (ref 1.7–2.4)

## 2017-04-23 LAB — TRIGLYCERIDES: TRIGLYCERIDES: 189 mg/dL — AB (ref <150)

## 2017-04-23 MED ORDER — BISACODYL 10 MG RE SUPP
10.0000 mg | Freq: Every day | RECTAL | Status: DC | PRN
  Administered 2017-04-29: 10 mg via RECTAL
  Filled 2017-04-23: qty 1

## 2017-04-23 MED ORDER — POTASSIUM CHLORIDE 20 MEQ/15ML (10%) PO SOLN
40.0000 meq | Freq: Once | ORAL | Status: AC
Start: 2017-04-23 — End: 2017-04-23
  Administered 2017-04-23: 40 meq
  Filled 2017-04-23: qty 30

## 2017-04-23 MED ORDER — INFLUENZA VAC SPLIT QUAD 0.5 ML IM SUSY
0.5000 mL | PREFILLED_SYRINGE | INTRAMUSCULAR | Status: AC
  Administered 2017-04-24: 0.5 mL via INTRAMUSCULAR
  Filled 2017-04-23: qty 0.5

## 2017-04-23 MED ORDER — THIAMINE HCL 100 MG/ML IJ SOLN
100.0000 mg | Freq: Every day | INTRAMUSCULAR | Status: DC
  Administered 2017-04-23 – 2017-04-27 (×5): 100 mg via INTRAVENOUS
  Filled 2017-04-23 (×5): qty 2

## 2017-04-23 MED ORDER — DEXMEDETOMIDINE HCL IN NACL 200 MCG/50ML IV SOLN
0.0000 ug/kg/h | INTRAVENOUS | Status: DC
  Administered 2017-04-23 (×2): 1.2 ug/kg/h via INTRAVENOUS
  Administered 2017-04-23: 0.4 ug/kg/h via INTRAVENOUS
  Administered 2017-04-23 (×2): 1.2 ug/kg/h via INTRAVENOUS
  Administered 2017-04-24: 1 ug/kg/h via INTRAVENOUS
  Administered 2017-04-24 (×2): 0.8 ug/kg/h via INTRAVENOUS
  Administered 2017-04-24: 1.2 ug/kg/h via INTRAVENOUS
  Administered 2017-04-24: 0.8 ug/kg/h via INTRAVENOUS
  Filled 2017-04-23 (×13): qty 50

## 2017-04-23 MED ORDER — FOLIC ACID 1 MG PO TABS
1.0000 mg | ORAL_TABLET | Freq: Every day | ORAL | Status: DC
  Administered 2017-04-23 – 2017-04-29 (×7): 1 mg
  Filled 2017-04-23 (×7): qty 1

## 2017-04-23 NOTE — Progress Notes (Signed)
PT Cancellation Note  Patient Details Name: Joseph Rubio MRN: [id number] DOB: May 31, 1966   Cancelled Treatment:    Reason Eval/Treat Not Completed: Medical issues which prohibited therapy(pt remains ventilated on bedrest)   Joseph Rubio 04/23/2017, 7:07 AM  Joseph Rubio, PT [phone]

## 2017-04-23 NOTE — Progress Notes (Signed)
OT Cancellation Note  Patient Details Name: Joseph Rubio MRN: [id number] DOB: Sep 17, 1965   Cancelled Treatment:    Reason Eval/Treat Not Completed: Patient not medically ready. Pt with continued bedrest orders. Will await increase in activity orders prior to initiating OT evaluation. Thank you for this referral!  Joseph Sectionharity A Carlotta Telfair, MS OTR/L  Pager: [phone]    Joseph Rubio 04/23/2017, 7:02 AM

## 2017-04-23 NOTE — Progress Notes (Signed)
The patient's son Lukka Black, arrived in the ICU and asked to speak to me. I met with him and the patient's ICU RN Manuela Schwartz. Explained patient's medical conditions and grave prognosis given the large intracranial hemorrhage with mass effect. Explained that there's a chance patient's mass effect will worsen leading to herniation and death. Alternatively patient may stabilize but would almost certainly require trach and peg and may never wean from the vent and would likely have hemiparesis if not more severe limitations. The patient's son says that the patient would not want to live like that "hooked up to machines." He is contemplating withdrawal of care but wants to get in touch with his siblings (68 other half-siblings besides himself, 41 of which are over the age of 46) as well as the patient's brothers and grandfather first before decision about withdrawal is made. For now he wants patient to be DNR with no escalation of care. Have placed orders for DNR and Do not escalate. Will opt to not place chest tube for patient's small/moderate sized pneumothorax as he is hemodynamically stable, and placing the chest tube would cause pain but not improve overall prognosis. Patient's son is agreeable to this plan.   Vernie Murders, MD Pulmonary & Critical Care Medicine Pager: [phone]

## 2017-04-23 NOTE — Progress Notes (Signed)
STROKE TEAM PROGRESS NOTE   HISTORY OF PRESENT ILLNESS (per record)  Joseph Rubio is an 51 y.o. male who was LKN at 2300 on Sunday night per EMS. He was left at home to babysit a child. When family returned, he was found down on the floor of the bathroom naked near the shower. Family suspected he had collapsed after getting out of the shower. He was found at 12:07 AM. He vomited at home prior to EMS arrival. En route they noted forced gaze deviation to the right with left sided weakness. On arrival to the ED he was able to speak to staff. During CT patient became altered. CT head showed a large posterior basal ganglia and thalamus hemorrhage on the right with intraventricular extension and mass effect upon the right half of the midbrain. He was able to state his PMHx to EMS en route as consisting of asthma only. He denied being on any medications Patient was not administered IV t-PA secondary to intracerebral hemorrhage He was admitted to the neuro ICU for further evaluation and treatment.   SUBJECTIVE (INTERVAL HISTORY) His  Son and uncle are at the bedside. Marland Kitchen He remains neurologically unresponsive requiring sedation and has purposeful movements on the right but plegic on the left. Blood pressure is controlled on Cardene drip  Hypertonic saline drip has been started and sodium is 143. Neurosurgery has evaluated patient and do not believe is a candidate for ventriculostomy   OBJECTIVE Temp:  [98.7 F (37.1 C)-100.4 F (38 C)] 100.1 F (37.8 C) (11/13 1200) Pulse Rate:  [64-96] 67 (11/13 1300) Cardiac Rhythm: Normal sinus rhythm (11/13 0800) Resp:  [15-22] 16 (11/13 1300) BP: (108-158)/(65-88) 134/81 (11/13 1300) SpO2:  [100 %] 100 % (11/13 1300) FiO2 (%):  [40 %-100 %] 100 % (11/13 1145) Weight:  [154 lb 5.2 oz (70 kg)] 154 lb 5.2 oz (70 kg) (11/13 0342)  CBC:  Recent Labs  Lab 04/21/2017 0040 04/15/2017 0055 04/23/17 0146  WBC 9.0  --  8.6  NEUTROABS 5.0  --  6.1  HGB 12.0* 14.3 9.0*   HCT 36.7* 42.0 28.7*  MCV 91.3  --  93.5  PLT 313  --  271    Basic Metabolic Panel:  Recent Labs  Lab 05/03/2017 0040 04/18/2017 0055  04/16/2017 1641 04/24/2017 1918 04/23/17 0146 04/23/17 0711  NA 137 140   < >  --  141 143  144 146*  K 3.1* 3.2*  --   --  3.6 3.6  --   CL 107 109  --   --   --  119*  --   CO2 14*  --   --   --   --  19*  --   GLUCOSE 125* 123*  --   --   --  124*  --   BUN 11 13  --   --   --  9  --   CREATININE 0.99 1.00  --   --   --  1.11  --   CALCIUM 8.5*  --   --   --   --  8.0*  --   MG  --   --    < > 2.0 2.0 2.2  --   PHOS  --   --   --  3.4  --  3.5  --    < > = values in this interval not displayed.    Lipid Panel:     Component Value Date/Time   TRIG  189 (H) 04/23/2017 0146   HgbA1c:  Lab Results  Component Value Date   HGBA1C 4.9 04/18/2017   Urine Drug Screen:     Component Value Date/Time   LABOPIA NONE DETECTED 04/28/2017 0615   COCAINSCRNUR POSITIVE (A) 05/07/2017 0615   LABBENZ NONE DETECTED 04/19/2017 0615   AMPHETMU NONE DETECTED 04/23/2017 0615   THCU POSITIVE (A) 04/30/2017 0615   LABBARB NONE DETECTED 04/29/2017 0615    Alcohol Level     Component Value Date/Time   ETH 40 (H) 04/23/2017 0133    IMAGING  Ct Head Wo Contrast  Result Date: 04/15/2017 CLINICAL DATA:  Follow-up intracranial hemorrhage 05/04/2017 EXAM: CT HEAD WITHOUT CONTRAST TECHNIQUE: Contiguous axial images were obtained from the base of the skull through the vertex without intravenous contrast. COMPARISON:  None. FINDINGS: Brain: Intraparenchymal hematoma centered in the right thalamus measures 4.5 x 2.8 x 3.7 cm (volume = 24 cm^3), previously 2.8 x 4.4 x 3.6 cm (volume = 23 cm^3). The amount of blood in the lateral ventricles is unchanged. Right temporal horn remains mildly dilated, but not increased from the prior study. The configuration of the ventricles is unchanged. There are no new areas of hemorrhage. Rule mm leftward midline shift at the foramina  Monro is unchanged. Vascular: No hyperdense vessel or unexpected calcification. Skull: Normal. Negative for fracture or focal lesion. Sinuses/Orbits: Severe left maxillary mucosal disease, unchanged. Other: None. IMPRESSION: Unchanged intraparenchymal hematoma centered in the left thalamus with associated intraventricular extension and 3 mm of leftward midline shift. No new hemorrhage. Unchanged size and configuration of the ventricles. Electronically Signed   By: Deatra RobinsonKevin  Herman M.D.   On: 04/29/2017 19:38   Ct Head Wo Contrast  Result Date: 04/21/2017 CLINICAL DATA:  Follow-up intracranial hemorrhage. EXAM: CT HEAD WITHOUT CONTRAST TECHNIQUE: Contiguous axial images were obtained from the base of the skull through the vertex without intravenous contrast. COMPARISON:  CT HEAD April 22, 2017 at 0054 hours FINDINGS: BRAIN: 2.8 x 4.4 x 3.6 cm (volume = 23 cc, previously 21 cc) RIGHT thalamic hematoma is slightly larger with surrounding low-density vasogenic edema. Again noted is intraventricular extension with mild hydrocephalus. 3 mm RIGHT to LEFT midline shift is similar. Narrowed though patent basal cisterns. 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 No acute large vascular territory infarcts. Minimal white matter hypodensities compatible with mild chronic small vessel ischemic disease. No abnormal extra-axial fluid collections. VASCULAR: Unremarkable. SKULL/SOFT TISSUES: No skull fracture. No significant soft tissue swelling. ORBITS/SINUSES: The included ocular globes and orbital contents are normal.The mastoid aircells and included paranasal sinuses are well-aerated. OTHER: None. IMPRESSION: 1. Evolving acute 2.8 x 4.4 x 3.6 cm RIGHT thalamus intraparenchymal hematoma. 2. Similar intraventricular extension of hemorrhage and mild hydrocephalus. 3 mm RIGHT to LEFT midline shift. 3. 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 Electronically Signed   By: Awilda Metroourtnay  Bloomer M.D.    On: 05/03/2017 03:24   Ct Cervical Spine Wo Contrast  Result Date: 04/20/2017 CLINICAL DATA:  Code stroke. 51 y/o M; right-sided weakness and days. EXAM: CT HEAD WITHOUT CONTRAST CT CERVICAL SPINE WITHOUT CONTRAST TECHNIQUE: Multidetector CT imaging of the head and cervical spine was performed following the standard protocol without intravenous contrast. Multiplanar CT image reconstructions of the cervical spine were also generated. COMPARISON:  None. FINDINGS: CT HEAD FINDINGS Brain: Acute hemorrhage centered in right thalamus measuring 3.9 x 2.9 x 3.6 cm (volume = 21 cm^3). The hemorrhage has decompressed into the ventricular system with large volume  of hemorrhage in right lateral ventricle and small volume of hemorrhage in the third ventricle as well as pooling in occipital horn of the left lateral ventricle. 5 mm right-to-left midline shift from mass effect and minimal downward compression of the midbrain. Mild hydrocephalus. No downward herniation, effacement of basilar cisterns, or large vascular territory stroke. Vascular: No hyperdense vessel or unexpected calcification. Skull: Normal. Negative for fracture or focal lesion. Sinuses/Orbits: Severe left maxillary sinus mucosal thickening in chronic inflammatory changes of the walls of the sinus. Chronic lamina papyracea fracture on the right. Otherwise negative. Other: None. CT CERVICAL SPINE FINDINGS Alignment: Straightening of cervical lordosis.  No listhesis. Skull base and vertebrae: No acute fracture. No primary bone lesion or focal pathologic process. Soft tissues and spinal canal: No prevertebral fluid or swelling. No visible canal hematoma. Disc levels: Mild cervical spondylosis with discogenic degenerative changes at the C5-C7 levels where there is mild loss of intervertebral disc space height and anterior marginal osteophytes. No significant bony foraminal or canal stenosis. Upper chest: Negative. Other: Negative. IMPRESSION: 1. Acute hemorrhage  centered in right thalamus, 21 cc. 2. Extension of hemorrhage into the ventricular system with large volume and right lateral ventricle and small volume within left lateral ventricle and third ventricle. 3. Mass effect with 5 mm right-to-left midline shift and minimal downward compression of the midbrain. 4. Mild hydrocephalus of lateral and third ventricles. 5. No acute fracture or dislocation of the cervical spine. These results were called by telephone at the time of interpretation on 12-30-2016 at 1:10 am to Dr. Otelia LimesLindzen, who verbally acknowledged these results. Electronically Signed   By: Mitzi HansenLance  Furusawa-Stratton M.D.   On: 12-30-2016 01:13   Dg Chest Port 1 View  Result Date: 04/23/2017 CLINICAL DATA:  Left pneumothorax. EXAM: PORTABLE CHEST 1 VIEW COMPARISON:  Radiograph of same day. FINDINGS: Stable cardiomediastinal silhouette. Endotracheal and nasogastric tubes are unchanged in position. Mild to moderate left pneumothorax is noted which is unchanged compared to prior exam. Mild left basilar subsegmental atelectasis is noted. No pleural effusion is noted. Bony thorax is unremarkable. IMPRESSION: Stable support apparatus. Stable left pneumothorax compared to prior exam. Mild left basilar subsegmental atelectasis. Electronically Signed   By: Lupita RaiderJames  Green Jr, M.D.   On: 04/23/2017 12:29   Dg Chest Port 1 View  Result Date: 04/23/2017 CLINICAL DATA:  Acute respiratory failure with hypoxia EXAM: PORTABLE CHEST 1 VIEW COMPARISON:  12-30-2016 FINDINGS: Cardiac shadow is stable. Endotracheal tube and nasogastric catheter are noted in satisfactory position. Lungs are well aerated bilaterally with bibasilar atelectatic changes. There is a new mild to moderate left pneumothorax identified when compared with the prior exam. No acute bony abnormality is noted. IMPRESSION: Bibasilar changes. Mild to moderate left pneumothorax new from the prior exam. Critical Value/emergent results were called by telephone at  the time of interpretation on 04/23/2017 at 7:32 am to Dr. Tyson AliasFeinstein, who verbally acknowledged these results. Electronically Signed   By: Alcide CleverMark  Lukens M.D.   On: 04/23/2017 07:21   Dg Chest Portable 1 View  Result Date: 12-30-2016 CLINICAL DATA:  51 year old male with acute intracranial hemorrhage status post intubation. EXAM: PORTABLE CHEST 1 VIEW COMPARISON:  CT of the head and neck dated 12-30-2016 FINDINGS: An endotracheal tube is noted with tip projecting at the level of the right fourth costovertebral junction. The carina is poorly visualized, however the tip of the tube is likely approximately 3 cm above the carina. An enteric tube extends into the left hemiabdomen with tip beyond the  inferior margin of the image. The lungs are clear. There is no pleural effusion or pneumothorax. The cardiac silhouette is within normal limits. No acute osseous pathology. IMPRESSION: Endotracheal tube with tip appearing above the carina. Enteric tube extends into the left hemiabdomen with tip beyond the inferior margin of the image. Electronically Signed   By: Elgie Collard M.D.   On: 04-26-17 01:51   Dg Abd Portable 1v  Result Date: 2017-04-26 CLINICAL DATA:  Orogastric tube placement. EXAM: PORTABLE ABDOMEN - 1 VIEW COMPARISON:  None. FINDINGS: Orogastric tube tip projects in proximal stomach, side port past GE junction. Included bowel gas pattern is nondilated and nonobstructive. Lung bases are clear. Soft tissue planes and included osseous structures are normal. IMPRESSION: Orogastric tube tip projects in proximal stomach. Electronically Signed   By: Awilda Metro M.D.   On: Apr 26, 2017 05:39   Ct Head Code Stroke Wo Contrast  Result Date: 04/26/17 CLINICAL DATA:  Code stroke. 51 y/o M; right-sided weakness and days. EXAM: CT HEAD WITHOUT CONTRAST CT CERVICAL SPINE WITHOUT CONTRAST TECHNIQUE: Multidetector CT imaging of the head and cervical spine was performed following the standard protocol  without intravenous contrast. Multiplanar CT image reconstructions of the cervical spine were also generated. COMPARISON:  None. FINDINGS: CT HEAD FINDINGS Brain: Acute hemorrhage centered in right thalamus measuring 3.9 x 2.9 x 3.6 cm (volume = 21 cm^3). The hemorrhage has decompressed into the ventricular system with large volume of hemorrhage in right lateral ventricle and small volume of hemorrhage in the third ventricle as well as pooling in occipital horn of the left lateral ventricle. 5 mm right-to-left midline shift from mass effect and minimal downward compression of the midbrain. Mild hydrocephalus. No downward herniation, effacement of basilar cisterns, or large vascular territory stroke. Vascular: No hyperdense vessel or unexpected calcification. Skull: Normal. Negative for fracture or focal lesion. Sinuses/Orbits: Severe left maxillary sinus mucosal thickening in chronic inflammatory changes of the walls of the sinus. Chronic lamina papyracea fracture on the right. Otherwise negative. Other: None. CT CERVICAL SPINE FINDINGS Alignment: Straightening of cervical lordosis.  No listhesis. Skull base and vertebrae: No acute fracture. No primary bone lesion or focal pathologic process. Soft tissues and spinal canal: No prevertebral fluid or swelling. No visible canal hematoma. Disc levels: Mild cervical spondylosis with discogenic degenerative changes at the C5-C7 levels where there is mild loss of intervertebral disc space height and anterior marginal osteophytes. No significant bony foraminal or canal stenosis. Upper chest: Negative. Other: Negative. IMPRESSION: 1. Acute hemorrhage centered in right thalamus, 21 cc. 2. Extension of hemorrhage into the ventricular system with large volume and right lateral ventricle and small volume within left lateral ventricle and third ventricle. 3. Mass effect with 5 mm right-to-left midline shift and minimal downward compression of the midbrain. 4. Mild hydrocephalus of  lateral and third ventricles. 5. No acute fracture or dislocation of the cervical spine. These results were called by telephone at the time of interpretation on 04-26-17 at 1:10 am to Dr. Otelia Limes, who verbally acknowledged these results. Electronically Signed   By: Mitzi Hansen M.D.   On: 2017/04/26 01:13       PHYSICAL EXAM Middle-aged African-American male who is sedated and intubated. . Afebrile. Head is nontraumatic. Neck is supple without bruit.    Cardiac exam no murmur or gallop. Lungs are clear to auscultation. Distal pulses are well felt. Neurological Exam : Patient is sedated and intubated. Eyes are closed. Pupils are small 2 mm sluggishly reactive. There is  skew eye deviation with right eye downward and outward. Corneal reflexes are preserved. Dolls eye moments are sluggish. Cough and gag reflexes are present. He does have respiratory effort above ventilator settings he moves right upper and lower extremity semi-purposefully to sternal rub. No movement in the left upper extremity and trace movement in the left lower extremity to painful stimuli. Tone is normal on the right and decreased on the left. Left plantar upgoing right downgoing. ASSESSMENT/PLAN Mr. NEEMA FLUEGGE is a 51 y.o. 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 Again noted is intraventricular extension with mild hydrocephalus. 3 mm RIGHT to LEFT midline shift is similar. Narrowed though patent basal cisterns. 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 No acute large vascular territory  infarcts  MRI head not ordered  MRA head not ordered  Carotid Doppler  not ordered  2D Echo  pending  LDL unable to calculate  HgbA1c pending   SCDs for VTE prophylaxis Diet NPO time specified  No antithrombotic prior to admission, now on No antithrombotic due to ICH  Therapy recommendations:  On hold Disposition:  pending Hypertension  Unstable  Permissive hypertension (OK if < 220/120) but gradually normalize in 5-7 days  Long-term BP goal normotensive  Hyperlipidemia  Home meds:  none  LDL cannot calculate goal < 70  Diabetes  HgbA1c pending goal < 7.0   Other Active Problems  Respiratory failure  Cocaine and marijuana abuse  Hospital day # 1  I have personally examined this patient, reviewed notes, independently viewed imaging studies, participated in medical decision making and plan of care.ROS completed by me personally and pertinent positives fully documented  I have made any additions or clarifications directly to the above note.  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 His prognosis is guarded. Recommend strict blood pressure control with the systolic blood pressure goal below   below 160. Hypertonic saline for cytotoxic edema with sodium goal 150-155. Await neurosurgical opinion on ventriculostomy decision. Continue intubation. Check CT angiogram of the brain tomorrow morning. Long discussion on the bedside with the patient's  Son and uncle and about his prognosis, plan of care and answered questions. Discussed with Dr. Merlene Pulling from critical care medicine. The patient's son became emotional and hyperventilating after discussion and needed to sit down. He kept asking for his asthma inhalers. His oxygen saturations were 99%. I explained to him that we could not give him the indications in the ICU and  he needed to be taken to the emergency room for evaluation. He agreed and was transported in the beachchair by the nurse to the ER for further evaluation. This patient is critically ill and at significant risk of neurological worsening, death and care requires constant monitoring of vital signs, hemodynamics,respiratory and cardiac monitoring, extensive review of multiple databases, frequent neurological assessment, discussion with family, other specialists and medical decision making of high complexity.I have made any additions or clarifications directly to the above note.This critical care time does not reflect procedure time, or teaching time or supervisory  time of PA/NP/Med Resident etc but could involve care discussion time.  I spent 60 minutes of neurocritical care time  in the care of  this patient.     Delia Heady, MD Medical Director [facility] Pager: [phone] 04/23/2017 1:49 PM   To contact Stroke Continuity provider, please refer to [email or web address]. After hours, contact General Neurology

## 2017-04-23 NOTE — Progress Notes (Signed)
PULMONARY / CRITICAL CARE MEDICINE   Name: Joseph Rubio MRN: [id number] DOB: 27-Jul-1965    ADMISSION DATE:  05/03/2017 CONSULTATION DATE:  04/15/2017  REFERRING MD:  Dr. Otelia Rubio   CHIEF COMPLAINT:  CVA  Brief Patient Summary:  51 year old male with PMH of Asthma on no meds, presented to ED on 11/12 after being found down in shower;  last spoke to family/ LKN 2300 on 11/11.  Vomited w/EMS.  Upon arrival to ED patient verbally responsive with left side weakness and right gaze deviation. While in CT scanner patient became altered. Requiring intubation. CT Head with large posterior basal ganglia and thalamus hemorrhage on the right with intraventricular extension and mass effect upon the right half of the midbrain.  UDS positive for cocaine and THC, ETOH 40.  NSGY consulted for possible EVD drain, deferred due no change in hemorrhage or change in ventricles given x3 head CTs.  Started on 3% Na per Neuro.  PCCM asked to consult for vent management.    SUBJECTIVE:  New Left mild to mod pneumothorax on CXR- spontaneous, no CVC Remains on propofol, fentanyl, and 3% Na at 70 ml/hr  Triglycerides 189 No further runs of SVT Off cardene  VITAL SIGNS: BP (!) 158/88   Pulse 73   Temp 99.3 F (37.4 C) (Axillary)   Resp 15   Ht 5\' 8"  (1.727 m)   Wt 154 lb 5.2 oz (70 kg)   SpO2 100%   BMI 23.46 kg/m   HEMODYNAMICS:    VENTILATOR SETTINGS: Vent Mode: PRVC FiO2 (%):  [40 %-100 %] 100 % Set Rate:  [16 bmp] 16 bmp Vt Set:  [500 mL] 500 mL PEEP:  [5 cmH20] 5 cmH20 Plateau Pressure:  [12 cmH20-17 cmH20] 12 cmH20  INTAKE / OUTPUT: I/O last 3 completed shifts: In: 4076.7 [I.V.:3122.1; NG/GT:954.6] Out: 1750 [Urine:1750]  PHYSICAL EXAMINATION: General:  Well nourished adult male sedated on MV, no acute distress HEENT: ETT/ OGT, pupils 2/sluggish Neuro: minimal response to pain/ flicker on right arm/leg, not f/c CV: s1s2 rrr, PULM: even/non-labored on MV, PIP 28, lungs bilaterally  clear GI: soft, bs  Extremities: warm/dry, no edema  Skin: no rashes  LABS:  BMET Recent Labs  Lab 04/18/2017 0040 04/24/2017 0055  04/20/2017 1918 04/23/17 0146 04/23/17 0711  NA 137 140   < > 141 143  144 146*  K 3.1* 3.2*  --  3.6 3.6  --   CL 107 109  --   --  119*  --   CO2 14*  --   --   --  19*  --   BUN 11 13  --   --  9  --   CREATININE 0.99 1.00  --   --  1.11  --   GLUCOSE 125* 123*  --   --  124*  --    < > = values in this interval not displayed.    Electrolytes Recent Labs  Lab 04/20/2017 0040 04/29/2017 1641 05/03/2017 1918 04/23/17 0146  CALCIUM 8.5*  --   --  8.0*  MG  --  2.0 2.0 2.2  PHOS  --  3.4  --  3.5    CBC Recent Labs  Lab 04/24/2017 0040 04/12/2017 0055 04/23/17 0146  WBC 9.0  --  8.6  HGB 12.0* 14.3 9.0*  HCT 36.7* 42.0 28.7*  PLT 313  --  271    Coag's Recent Labs  Lab 04/21/2017 0040  APTT 28  INR 1.23  Sepsis Markers No results for input(s): LATICACIDVEN, PROCALCITON, O2SATVEN in the last 168 hours.  ABG Recent Labs  Lab 04/30/2017 0235 04/23/17 0345  PHART 7.329* 7.327*  PCO2ART 29.3* 40.4  PO2ART 375* 122*    Liver Enzymes Recent Labs  Lab 05/10/2017 0040  AST 36  ALT 25  ALKPHOS 74  BILITOT 0.7  ALBUMIN 3.9    Cardiac Enzymes No results for input(s): TROPONINI, PROBNP in the last 168 hours.  Glucose Recent Labs  Lab 04/14/2017 1131 04/30/2017 1556 05/04/2017 1929 04/24/2017 2321 04/23/17 0315 04/23/17 0800  GLUCAP 128* 135* 115* 131* 133* 145*    Imaging Ct Head Wo Contrast  Result Date: 04/24/2017 CLINICAL DATA:  Follow-up intracranial hemorrhage 05/09/2017 EXAM: CT HEAD WITHOUT CONTRAST TECHNIQUE: Contiguous axial images were obtained from the base of the skull through the vertex without intravenous contrast. COMPARISON:  None. FINDINGS: Brain: Intraparenchymal hematoma centered in the right thalamus measures 4.5 x 2.8 x 3.7 cm (volume = 24 cm^3), previously 2.8 x 4.4 x 3.6 cm (volume = 23 cm^3). The amount  of blood in the lateral ventricles is unchanged. Right temporal horn remains mildly dilated, but not increased from the prior study. The configuration of the ventricles is unchanged. There are no new areas of hemorrhage. Rule mm leftward midline shift at the foramina Monro is unchanged. Vascular: No hyperdense vessel or unexpected calcification. Skull: Normal. Negative for fracture or focal lesion. Sinuses/Orbits: Severe left maxillary mucosal disease, unchanged. Other: None. IMPRESSION: Unchanged intraparenchymal hematoma centered in the left thalamus with associated intraventricular extension and 3 mm of leftward midline shift. No new hemorrhage. Unchanged size and configuration of the ventricles. Electronically Signed   By: Deatra Robinson M.D.   On: 05/05/2017 19:38   Dg Chest Port 1 View  Result Date: 04/23/2017 CLINICAL DATA:  Acute respiratory failure with hypoxia EXAM: PORTABLE CHEST 1 VIEW COMPARISON:  05/07/2017 FINDINGS: Cardiac shadow is stable. Endotracheal tube and nasogastric catheter are noted in satisfactory position. Lungs are well aerated bilaterally with bibasilar atelectatic changes. There is a new mild to moderate left pneumothorax identified when compared with the prior exam. No acute bony abnormality is noted. IMPRESSION: Bibasilar changes. Mild to moderate left pneumothorax new from the prior exam. Critical Value/emergent results were called by telephone at the time of interpretation on 04/23/2017 at 7:32 am to Dr. Tyson Alias, who verbally acknowledged these results. Electronically Signed   By: Alcide Clever M.D.   On: 04/23/2017 07:21   STUDIES:  CT Head 11/12 > 1. Acute hemorrhage centered in right thalamus, 21 cc. 2. Extension of hemorrhage into the ventricular system with large volume and right lateral ventricle and small volume within left lateral ventricle and third ventricle. 3. Mass effect with 5 mm right-to-left midline shift and minimal downward compression of the  midbrain. 4. Mild hydrocephalus of lateral and third ventricles. CT C Spine 11/12 >  No acute fracture or dislocation of the cervical spine. CXR 11/12 > Endotracheal tube with tip appearing above the carina. Enteric tube extends into the left hemiabdomen with tip beyond the inferior margin of the image. CT head 11/12 at 1848 >> Unchanged intraparenchymal hematoma centered in the left thalamus with associated intraventricular extension and 3 mm of leftward midline shift. No new hemorrhage. Unchanged size and configuration of the ventricles CXR 11/13 > stable support apparatus; mild to mod left pnx- new; bibasilar changes CXR 11/13 noon >  Stable left pnx, mild basilar subsegmental atelectasis  CULTURES: None.   ANTIBIOTICS: None.  SIGNIFICANT EVENTS: 11/12 > Presents to ED, intubated, 3% 11/13 >  Left mild/mod pnx  LINES/TUBES: ETT 11/12 >> OGT 11/12  DISCUSSION: 51 year old male presents to ED with left sided weakness and right gaze deviation. CT Head with right thalamus hemorrhage with extension in the the ventricular system.  UDS + cocaine/TCH, ETOH 40  ASSESSMENT / PLAN:  Respiratory Insufficiency in setting of Acute CVA H/O Asthma - no home meds Left mild to mod pneumothorax 11/13- likely from inhaled illegal drug abuse, spontaneous, +/- need for MV Tobacco Abuse Plan  - Continue full vent support - no SBT given pnx and ICH/ with cerebral edema - Continue FiO2 at 100% to promote reabsorption of pnx - May need pigtail CT if worsens, monitor closely for acute change, needle decompression supplies at bedside.  Remains hemodynamically stable at this point.  - repeat CXR w/stable pneumothorax - repeat CXR at 5pm to reassess pnx - Trend ABG/CXR - VAP protocol - PPI for SUP  HTN Crisis - + cocaine - BP 209/110 in ED  - troponin x1 neg Plan  - Cardiac Monitoring  - Cardene prn for BP goal < 220/120 - TTE pending - no beta blockers given cocaine use, d/c labetalol -  continue cautiously w/ norvasc and HCTZ while on continuous sedation to prevent acute hypotension  Hypokalemia  - Mg 2.2 Plan  -Trend BMP -Replace electrolytes as indicated   Acute Hemorrhage -INR 15.4, APTT 28 Plan  -Trend CBC  -Hold all anticoagulation -SCDs only    Acute Encephalopathy secondary to Acute CVA  Right Thalamus Acute Hemorrhage with extension into the ventricular system with 5 mm right to left midline shift  ETOH, cocaine, and THC abuse - admission ETOH Level 40  Plan  -  Neurology Primary -  NGSY following, EVD not indicated at this point given stable bleed/ ventricles  -  Frequent Neuro Checks   -  MRI/MRA ordered  -  Currently on Hypertonic Saline per Neurology at 70 ml/hr; if > 75 ml/hr will need CVC placed, goal 150-155 - trend Sodium q6h  - RASS goal 0/-1  - d/c propofol given high triglycerides, continue with fentanyl gtt and start precedex - PAD protocol with bowel regimen - add MVI, thiamine, and folate  FAMILY  - Updates: Son at bedside and cousin updated at bedside by Dr. Pearlean BrownieSethi, during which given poor prognosis, son became emotionally distraught with breathing issues and taken to ER by staff.    - Inter-disciplinary family meet or Palliative Care meeting due by: 04/29/2017   DVT prophylaxis: SCDs SUP: PPI Diet: NPO/ TF  Activity: bedrest Disposition : ICU   CC Time: 45 minutes  Posey BoyerBrooke Dalonda Simoni, AGACNP-BC [facility] Pgr: [phone] or if no answer [phone] 04/23/2017, 1:09 PM

## 2017-04-23 NOTE — Progress Notes (Signed)
PCCM Interval Note  S:   Mild to Moderate new LEFT pneumothorax identified on am CXR.     O:  PIP 29, no ventilatory issues  Blood pressure 138/80, pulse 69, temperature 99.1 F (37.3 C), temperature source Axillary, resp. rate (!) 21, height 5\' 8"  (1.727 m), weight 154 lb 5.2 oz (70 kg), SpO2 100 %.  Vent Mode: PRVC FiO2 (%):  [40 %] 40 % Set Rate:  [16 bmp] 16 bmp Vt Set:  [500 mL] 500 mL PEEP:  [5 cmH20] 5 cmH20 Plateau Pressure:  [12 cmH20-17 cmH20] 17 cmH20   A/P:  New Left mild to moderate pneumothorax on MV, no central line placement   P:  Increase FiO2 to 100% Monitor for ventilatory changes- high PIP Monitor hemodynamics for tachycardia/ hypotension Repeat CXR at noon No SBT If does not resolve or acutely decompensates will place pigtail CT - changes communicated to patients RN and RT  Additional CCT: 15 mins  Joseph BoyerBrooke Rubio, AGACNP-BC [facility] Pgr: [phone] or if no answer [phone] 04/23/2017, 7:59 AM

## 2017-04-23 NOTE — Progress Notes (Signed)
   04/23/17 1500  Clinical Encounter Type  Visited With Patient and family together  Visit Type Initial  Referral From Nurse  Consult/Referral To Chaplain  Spiritual Encounters  Spiritual Needs Grief support  Stress Factors  Patient Stress Factors None identified  Family Stress Factors Family relationships;Health changes;Loss  Chaplain was called to the room as PT son received bad news.  The bad news that was received caused the son to have a panic attack and asthma attack.  Chaplain had rapid response team called and escorted PT son to ED.  Sat with and prayed with PT son.

## 2017-04-23 NOTE — Plan of Care (Signed)
Pt currently intubated.  OG tube in place with Vital Hi 1.2 at 65 mL/hour.  Heloise PurpuraSusan Tomeika Weinmann RN

## 2017-04-24 ENCOUNTER — Inpatient Hospital Stay (HOSPITAL_COMMUNITY): Payer: Medicaid Other

## 2017-04-24 DIAGNOSIS — J690 Pneumonitis due to inhalation of food and vomit: Secondary | ICD-10-CM

## 2017-04-24 LAB — CBC WITH DIFFERENTIAL/PLATELET
Basophils Absolute: 0 10*3/uL (ref 0.0–0.1)
Basophils Relative: 0 %
EOS PCT: 0 %
Eosinophils Absolute: 0 10*3/uL (ref 0.0–0.7)
HEMATOCRIT: 28.8 % — AB (ref 39.0–52.0)
Hemoglobin: 8.7 g/dL — ABNORMAL LOW (ref 13.0–17.0)
LYMPHS ABS: 1.4 10*3/uL (ref 0.7–4.0)
LYMPHS PCT: 11 %
MCH: 29.3 pg (ref 26.0–34.0)
MCHC: 30.2 g/dL (ref 30.0–36.0)
MCV: 97 fL (ref 78.0–100.0)
MONO ABS: 1 10*3/uL (ref 0.1–1.0)
MONOS PCT: 8 %
NEUTROS ABS: 10.5 10*3/uL — AB (ref 1.7–7.7)
Neutrophils Relative %: 81 %
PLATELETS: 296 10*3/uL (ref 150–400)
RBC: 2.97 MIL/uL — ABNORMAL LOW (ref 4.22–5.81)
RDW: 15.9 % — AB (ref 11.5–15.5)
WBC: 13 10*3/uL — ABNORMAL HIGH (ref 4.0–10.5)

## 2017-04-24 LAB — BASIC METABOLIC PANEL
ANION GAP: 5 (ref 5–15)
BUN: 6 mg/dL (ref 6–20)
CHLORIDE: 122 mmol/L — AB (ref 101–111)
CO2: 21 mmol/L — AB (ref 22–32)
Calcium: 8.5 mg/dL — ABNORMAL LOW (ref 8.9–10.3)
Creatinine, Ser: 1 mg/dL (ref 0.61–1.24)
GFR calc Af Amer: >60 mL/min (ref >60)
GLUCOSE: 158 mg/dL — AB (ref 65–99)
POTASSIUM: 3.9 mmol/L (ref 3.5–5.1)
Sodium: 148 mmol/L — ABNORMAL HIGH (ref 135–145)

## 2017-04-24 LAB — BLOOD GAS, ARTERIAL
ACID-BASE DEFICIT: 3.3 mmol/L — AB (ref 0.0–2.0)
Bicarbonate: 21.4 mmol/L (ref 20.0–28.0)
DRAWN BY: 39898
FIO2: 0.6
O2 Saturation: 94.7 %
PEEP: 5 cmH2O
PH ART: 7.316 — AB (ref 7.350–7.450)
Patient temperature: 102.7
RATE: 16 resp/min
VT: 500 mL
pCO2 arterial: 44.6 mmHg (ref 32.0–48.0)
pO2, Arterial: 83.2 mmHg (ref 83.0–108.0)

## 2017-04-24 LAB — GLUCOSE, CAPILLARY
GLUCOSE-CAPILLARY: 163 mg/dL — AB (ref 65–99)
GLUCOSE-CAPILLARY: 149 mg/dL — AB (ref 65–99)
GLUCOSE-CAPILLARY: 159 mg/dL — AB (ref 65–99)
Glucose-Capillary: 116 mg/dL — ABNORMAL HIGH (ref 65–99)
Glucose-Capillary: 123 mg/dL — ABNORMAL HIGH (ref 65–99)
Glucose-Capillary: 137 mg/dL — ABNORMAL HIGH (ref 65–99)

## 2017-04-24 LAB — SODIUM
SODIUM: 150 mmol/L — AB (ref 135–145)
SODIUM: 150 mmol/L — AB (ref 135–145)
Sodium: 148 mmol/L — ABNORMAL HIGH (ref 135–145)
Sodium: 152 mmol/L — ABNORMAL HIGH (ref 135–145)

## 2017-04-24 LAB — PROCALCITONIN: Procalcitonin: <0.1 ng/mL

## 2017-04-24 LAB — LACTIC ACID, PLASMA: Lactic Acid, Venous: 0.9 mmol/L (ref 0.5–1.9)

## 2017-04-24 LAB — PHOSPHORUS: PHOSPHORUS: 3.1 mg/dL (ref 2.5–4.6)

## 2017-04-24 LAB — MAGNESIUM: Magnesium: 2.1 mg/dL (ref 1.7–2.4)

## 2017-04-24 MED ORDER — PIPERACILLIN-TAZOBACTAM 3.375 G IVPB
3.3750 g | Freq: Three times a day (TID) | INTRAVENOUS | Status: DC
  Administered 2017-04-24 – 2017-04-27 (×8): 3.375 g via INTRAVENOUS
  Filled 2017-04-24 (×9): qty 50

## 2017-04-24 MED ORDER — DEXMEDETOMIDINE HCL IN NACL 400 MCG/100ML IV SOLN
0.4000 ug/kg/h | INTRAVENOUS | Status: DC
  Administered 2017-04-24: 1.2 ug/kg/h via INTRAVENOUS
  Administered 2017-04-24: 1.181 ug/kg/h via INTRAVENOUS
  Administered 2017-04-25: 1.2 ug/kg/h via INTRAVENOUS
  Administered 2017-04-25: 0.8 ug/kg/h via INTRAVENOUS
  Administered 2017-04-25: 0.4 ug/kg/h via INTRAVENOUS
  Administered 2017-04-26: 1 ug/kg/h via INTRAVENOUS
  Administered 2017-04-26: 0.8 ug/kg/h via INTRAVENOUS
  Administered 2017-04-26 – 2017-04-27 (×2): 0.7 ug/kg/h via INTRAVENOUS
  Administered 2017-04-28: 0.9 ug/kg/h via INTRAVENOUS
  Administered 2017-04-28 (×2): 0.7 ug/kg/h via INTRAVENOUS
  Administered 2017-04-29 (×2): 1.4 ug/kg/h via INTRAVENOUS
  Administered 2017-04-29: 0.8 ug/kg/h via INTRAVENOUS
  Filled 2017-04-24 (×19): qty 100

## 2017-04-24 MED ORDER — DEXMEDETOMIDINE HCL IN NACL 200 MCG/50ML IV SOLN: 0.0000 ug/kg/h | INTRAVENOUS | Status: DC

## 2017-04-24 MED ORDER — PIPERACILLIN-TAZOBACTAM 3.375 G IVPB 30 MIN
3.3750 g | Freq: Once | INTRAVENOUS | Status: AC
  Administered 2017-04-24: 3.375 g via INTRAVENOUS
  Filled 2017-04-24: qty 50

## 2017-04-24 MED ORDER — NICARDIPINE HCL IN NACL 20-0.86 MG/200ML-% IV SOLN
3.0000 mg/h | INTRAVENOUS | Status: DC
  Administered 2017-04-25 (×2): 4 mg/h via INTRAVENOUS
  Administered 2017-04-26 – 2017-04-27 (×3): 5 mg/h via INTRAVENOUS
  Filled 2017-04-24 (×7): qty 200

## 2017-04-24 MED ORDER — MIDAZOLAM HCL 2 MG/2ML IJ SOLN
2.0000 mg | INTRAMUSCULAR | Status: DC | PRN
  Administered 2017-04-24 – 2017-04-29 (×13): 2 mg via INTRAVENOUS
  Filled 2017-04-24 (×14): qty 2

## 2017-04-24 MED ORDER — MIDAZOLAM HCL 2 MG/2ML IJ SOLN
INTRAMUSCULAR | Status: AC
Start: 2017-04-24 — End: 2017-04-24
  Filled 2017-04-24: qty 2

## 2017-04-24 MED ORDER — DOCUSATE SODIUM 50 MG/5ML PO LIQD
100.0000 mg | Freq: Two times a day (BID) | ORAL | Status: DC
  Administered 2017-04-24 – 2017-04-29 (×11): 100 mg via ORAL
  Filled 2017-04-24 (×11): qty 10

## 2017-04-24 MED ORDER — NICARDIPINE HCL IN NACL 40-0.83 MG/200ML-% IV SOLN: 3.0000 mg/h | INTRAVENOUS | Status: DC

## 2017-04-24 MED ORDER — AMLODIPINE BESYLATE 10 MG PO TABS
10.0000 mg | ORAL_TABLET | Freq: Every day | ORAL | Status: DC
  Administered 2017-04-24 – 2017-04-29 (×6): 10 mg via ORAL
  Filled 2017-04-24 (×6): qty 1

## 2017-04-24 MED ORDER — IOPAMIDOL (ISOVUE-370) INJECTION 76%
INTRAVENOUS | Status: AC
  Administered 2017-04-24: 50 mL
  Filled 2017-04-24: qty 50

## 2017-04-24 MED ORDER — SENNOSIDES 8.8 MG/5ML PO SYRP
5.0000 mL | ORAL_SOLUTION | Freq: Two times a day (BID) | ORAL | Status: DC
  Administered 2017-04-24 – 2017-04-29 (×10): 5 mL via ORAL
  Filled 2017-04-24 (×10): qty 5

## 2017-04-24 NOTE — Progress Notes (Signed)
OT Cancellation    04/24/17 0700  OT Visit Information  Last OT Received On 04/24/17  Reason Eval/Treat Not Completed Medical issues which prohibited therapy (Pt on bedrest. Will return as pt is medically ready.)   Dontavian Marchi MSOT, OTR/L Acute Rehab Pager: [phone] Office: [phone] 

## 2017-04-24 NOTE — Progress Notes (Signed)
STROKE TEAM PROGRESS NOTE   HISTORY OF PRESENT ILLNESS (per record)  Joseph Rubio is an 51 y.o. male who was LKN at 2300 on Sunday night per EMS. He was left at home to babysit a child. When family returned, he was found down on the floor of the bathroom naked near the shower. Family suspected he had collapsed after getting out of the shower. He was found at 12:07 AM. He vomited at home prior to EMS arrival. En route they noted forced gaze deviation to the right with left sided weakness. On arrival to the ED he was able to speak to staff. During CT patient became altered. CT head showed a large posterior basal ganglia and thalamus hemorrhage on the right with intraventricular extension and mass effect upon the right half of the midbrain. He was able to state his PMHx to EMS en route as consisting of asthma only. He denied being on any medications Patient was not administered IV t-PA secondary to intracerebral hemorrhage He was admitted to the neuro ICU for further evaluation and treatment.   SUBJECTIVE (INTERVAL HISTORY) His  Son and uncle are at the bedside. Marland Kitchen He remains agitated requiring sedation and has purposeful movements on the right but plegic on the left. Blood pressure is controlled on Cardene drip  Hypertonic saline drip has been started and sodium is 150  . He spiked temperature 102 last night and CCM is addressing this. CT angiogram today shows no large vessel stenosis, occlusion or aneurysm. Stable hemorrhage and intraventricular extension without worsening hydrocephalus OBJECTIVE Temp:  [98.1 F (36.7 C)-102.7 F (39.3 C)] 98.1 F (36.7 C) (11/14 1238) Pulse Rate:  [57-97] 64 (11/14 1300) Cardiac Rhythm: Normal sinus rhythm (11/14 0800) Resp:  [14-21] 21 (11/14 1100) BP: (121-180)/(74-98) 160/91 (11/14 1300) SpO2:  [98 %-100 %] 100 % (11/14 1300) FiO2 (%):  [40 %-100 %] 100 % (11/14 1235) Weight:  [156 lb 12 oz (71.1 kg)] 156 lb 12 oz (71.1 kg) (11/14 0403)  CBC:  Recent Labs   Lab 04/23/17 0146 04/24/17 0340  WBC 8.6 13.0*  NEUTROABS 6.1 10.5*  HGB 9.0* 8.7*  HCT 28.7* 28.8*  MCV 93.5 97.0  PLT 271 296    Basic Metabolic Panel:  Recent Labs  Lab 04/23/17 0146  04/23/17 1425  04/24/17 0340 04/24/17 0717  NA 143  144   < > 147*   < > 148* 150*  K 3.6  --   --   --  3.9  --   CL 119*  --   --   --  122*  --   CO2 19*  --   --   --  21*  --   GLUCOSE 124*  --   --   --  158*  --   BUN 9  --   --   --  6  --   CREATININE 1.11  --   --   --  1.00  --   CALCIUM 8.0*  --   --   --  8.5*  --   MG 2.2  --  2.2  --  2.1  --   PHOS 3.5  --  2.6  --  3.1  --    < > = values in this interval not displayed.    Lipid Panel:     Component Value Date/Time   TRIG 189 (H) 04/23/2017 0146   HgbA1c:  Lab Results  Component Value Date   HGBA1C 4.9 04/20/2017  Urine Drug Screen:     Component Value Date/Time   LABOPIA NONE DETECTED 2016/07/03 0615   COCAINSCRNUR POSITIVE (A) 2016/07/03 0615   LABBENZ NONE DETECTED 2016/07/03 0615   AMPHETMU NONE DETECTED 2016/07/03 0615   THCU POSITIVE (A) 2016/07/03 0615   LABBARB NONE DETECTED 2016/07/03 0615    Alcohol Level     Component Value Date/Time   ETH 40 (H) 2016/07/03 0133    IMAGING  Ct Angio Head W Or Wo Contrast  Result Date: 04/24/2017 CLINICAL DATA:  Follow-up exam for intracranial hemorrhage. EXAM: CT ANGIOGRAPHY HEAD TECHNIQUE: Multidetector CT imaging of the head was performed using the standard protocol during bolus administration of intravenous contrast. Multiplanar CT image reconstructions and MIPs were obtained to evaluate the vascular anatomy. CONTRAST:  50mL ISOVUE-370 IOPAMIDOL (ISOVUE-370) INJECTION 76% COMPARISON:  Prior CT from 2016/07/03. FINDINGS: CT HEAD Brain: Intraparenchymal hematoma centered at the left thalamus is overall little interval changed measuring 4.5 x 2.7 x 3.8 cm (previously 4.5 x 2.8 x 3.7 cm). Surrounding low-density vasogenic edema relatively similar. Localized  right-to-left midline shift measuring up to approximately 5 mm not significantly changed. Intraventricular extension with blood in mean lateral and third ventricles is stable to slightly decreased. Ventricular dilatation is relatively similar to previous. Basilar cisterns remain patent. No new intracranial hemorrhage. No other acute large vessel territory infarct. No mass lesion. No extra-axial fluid collection. Vascular: No hyperdense vessel. Skull: Scalp soft tissues and calvarium within normal limits. Sinuses: Chronic paranasal sinus disease, most evident within the ethmoidal air cells and left maxillary sinus. Remote posttraumatic defect noted at the right lamina for pre shift. Mastoid air cells are clear. Orbits: Globes and orbital soft tissues within normal limits. CTA HEAD Anterior circulation: Distal cervical segments of the internal carotid arteries are widely patent bilaterally. Petrous segments widely patent. Scattered atheromatous plaque throughout the cavernous/supraclinoid ICAs with moderate multifocal narrowing (up to approximately 50%). ICA termini widely patent. A1 segments patent bilaterally. Normal anterior communicating artery. Anterior cerebral arteries demonstrate multifocal atheromatous irregularity without high-grade stenosis. M1 segments demonstrate mild atheromatous irregularity but are patent without flow-limiting stenosis. Normal MCA bifurcations. No proximal M2 occlusion. Distal MCA branches demonstrate atheromatous irregularity but are well perfused and symmetric. Posterior circulation: Vertebral artery is patent to the vertebrobasilar junction without stenosis. Posterior inferior cerebral arteries patent proximally. Basilar artery patent to its distal aspect without stenosis. Superior cerebral arteries patent bilaterally. Both of the posterior cerebral arteries primarily supplied via the basilar. PCAs demonstrate mild diffuse atheromatous regularity without flow-limiting stenosis.  Venous sinuses: Grossly patent. Anatomic variants: None significant. No intracranial aneurysm. No vascular abnormality seen underlying the right thalamic hematoma. Delayed phase: No pathologic enhancement. IMPRESSION: 1. Overall little interval change in size and appearance of acute intraparenchymal hematoma centered at the left thalamus with intraventricular extension and mild localized midline shift. No significant interval change in size and configuration of the ventricles. 2. Negative CTA with no vascular abnormality underlying the intraparenchymal hematoma identified. 3. Moderate carotid siphon atherosclerosis without high-grade flow-limiting stenosis. No hemodynamically significant or correctable stenosis within the intracranial circulation. Electronically Signed   By: Rise MuBenjamin  McClintock M.D.   On: 04/24/2017 05:21   Ct Head Wo Contrast  Result Date: 2016/07/03 CLINICAL DATA:  Follow-up intracranial hemorrhage 2016/07/03 EXAM: CT HEAD WITHOUT CONTRAST TECHNIQUE: Contiguous axial images were obtained from the base of the skull through the vertex without intravenous contrast. COMPARISON:  None. FINDINGS: Brain: Intraparenchymal hematoma centered in the right thalamus measures 4.5 x 2.8 x 3.7  cm (volume = 24 cm^3), previously 2.8 x 4.4 x 3.6 cm (volume = 23 cm^3). The amount of blood in the lateral ventricles is unchanged. Right temporal horn remains mildly dilated, but not increased from the prior study. The configuration of the ventricles is unchanged. There are no new areas of hemorrhage. Rule mm leftward midline shift at the foramina Monro is unchanged. Vascular: No hyperdense vessel or unexpected calcification. Skull: Normal. Negative for fracture or focal lesion. Sinuses/Orbits: Severe left maxillary mucosal disease, unchanged. Other: None. IMPRESSION: Unchanged intraparenchymal hematoma centered in the left thalamus with associated intraventricular extension and 3 mm of leftward midline shift. No  new hemorrhage. Unchanged size and configuration of the ventricles. Electronically Signed   By: Deatra RobinsonKevin  Herman M.D.   On: 04/28/2017 19:38   Dg Chest Port 1 View  Result Date: 04/24/2017 CLINICAL DATA:  Acute respiratory failure with hypoxia ; left-sided pneumothorax. EXAM: PORTABLE CHEST 1 VIEW COMPARISON:  Portable chest x-ray of April 23, 2017 FINDINGS: There remains at approximately 10-15% left pneumothorax. Persistent retrocardiac density is present. There is small left pleural effusion. There are coarse lung markings in the right infrahilar region. The heart and pulmonary vascularity are normal. The endotracheal tube tip projects 5.2 cm above the carina. The esophagogastric tube tip in proximal port project below the inferior margin of the image. IMPRESSION: Stable 10-15% left upper pneumothorax. Persistent bibasilar atelectasis or pneumonia greatest on the left. The support tubes are in reasonable position. Electronically Signed   By: David  SwazilandJordan M.D.   On: 04/24/2017 07:22   Dg Chest Port 1 View  Result Date: 04/23/2017 CLINICAL DATA:  Follow-up pneumothorax EXAM: PORTABLE CHEST 1 VIEW COMPARISON:  04/23/2017, 04/21/2017 FINDINGS: Endotracheal tube tip is about 3 cm superior to the carina. Esophageal tube tip is below the diaphragm. Persistent dense left lower lobe consolidation. Right infrahilar atelectasis or infiltrate, no change. Stable cardiomediastinal silhouette. Re- demonstrated left apical, lateral, and basilar pneumothorax without significant interval change IMPRESSION: 1. Stable mild to moderate left pneumothorax 2. No change in left lower lobe consolidation and right infrahilar atelectasis or infiltrate. Electronically Signed   By: Jasmine PangKim  Fujinaga M.D.   On: 04/23/2017 18:53   Dg Chest Port 1 View  Result Date: 04/23/2017 CLINICAL DATA:  Left pneumothorax. EXAM: PORTABLE CHEST 1 VIEW COMPARISON:  Radiograph of same day. FINDINGS: Stable cardiomediastinal silhouette. Endotracheal  and nasogastric tubes are unchanged in position. Mild to moderate left pneumothorax is noted which is unchanged compared to prior exam. Mild left basilar subsegmental atelectasis is noted. No pleural effusion is noted. Bony thorax is unremarkable. IMPRESSION: Stable support apparatus. Stable left pneumothorax compared to prior exam. Mild left basilar subsegmental atelectasis. Electronically Signed   By: Lupita RaiderJames  Green Jr, M.D.   On: 04/23/2017 12:29   Dg Chest Port 1 View  Result Date: 04/23/2017 CLINICAL DATA:  Acute respiratory failure with hypoxia EXAM: PORTABLE CHEST 1 VIEW COMPARISON:  04/27/2017 FINDINGS: Cardiac shadow is stable. Endotracheal tube and nasogastric catheter are noted in satisfactory position. Lungs are well aerated bilaterally with bibasilar atelectatic changes. There is a new mild to moderate left pneumothorax identified when compared with the prior exam. No acute bony abnormality is noted. IMPRESSION: Bibasilar changes. Mild to moderate left pneumothorax new from the prior exam. Critical Value/emergent results were called by telephone at the time of interpretation on 04/23/2017 at 7:32 am to Dr. Tyson AliasFeinstein, who verbally acknowledged these results. Electronically Signed   By: Alcide CleverMark  Lukens M.D.   On: 04/23/2017 07:21  CT angio brain 04/24/2017 : 1. Overall little interval change in size and appearance of acute intraparenchymal hematoma centered at the left thalamus with intraventricular extension and mild localized midline shift. No significant interval change in size and configuration of the ventricles. 2. Negative CTA with no vascular abnormality underlying the intraparenchymal hematoma identified. 3. Moderate carotid siphon atherosclerosis without high-grade flow-limiting stenosis. No hemodynamically significant or correctable stenosis within the intracranial circulation   PHYSICAL EXAM Middle-aged African-American male who is sedated and intubated. . Afebrile. Head is  nontraumatic. Neck is supple without bruit.    Cardiac exam no murmur or gallop. Lungs are clear to auscultation. Distal pulses are well felt. Neurological Exam : Patient is sedated and intubated. Eyes are closed. He opens eyes to commands. Follows few commands on the right side. Pupils are small 2 mm sluggishly reactive. There is skew eye deviation with right eye downward and outward. Corneal reflexes are preserved. Dolls eye moments are sluggish. Cough and gag reflexes are present. He does have respiratory effort above ventilator settings he moves right upper and lower extremity semi-purposefully to sternal rub. No movement in the left upper extremity and trace movement in the left lower extremity to painful stimuli. Tone is normal on the right and decreased on the left. Left plantar upgoing right downgoing. ASSESSMENT/PLAN Joseph Rubio is a 51 y.o. 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 Again noted is intraventricular extension with mild hydrocephalus. 3 mm RIGHT to LEFT midline shift is similar. Narrowed though patent basal cisterns. 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 No acute large vascular territory infarcts  MRI head not ordered  MRA head not ordered  Carotid Doppler  not ordered  2D Echo  pending  LDL unable to calculate  HgbA1c pending   SCDs for VTE prophylaxis Diet NPO time specified  No antithrombotic prior to admission, now on No antithrombotic due to ICH  Therapy recommendations:  On hold Disposition:   pending Hypertension  Unstable  Permissive hypertension (OK if < 220/120) but gradually normalize in 5-7 days  Long-term BP goal normotensive  Hyperlipidemia  Home meds:  none  LDL cannot calculate goal < 70  Diabetes  HgbA1c pending goal < 7.0   Other Active Problems  Respiratory failure  Cocaine and marijuana abuse  Hospital day # 2  I have personally examined this patient, reviewed notes, independently viewed imaging studies, participated in medical decision making and plan of care.ROS completed by me personally and pertinent positives fully documented  I have made any additions or clarifications directly to the above note.  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 His prognosis is guarded. Recommend strict blood pressure control with the systolic blood pressure goal below   below 160. Hypertonic saline for cytotoxic edema with sodium goal 150-155.  Continue intubation.  . Family to decide on goals of care. May consider extubation in a few days if stable but decision about DO NOT RESUSCITATE, reintubation, tracheostomy or PEG tube need to be made by family prior  to that This patient is critically ill and at significant risk of neurological worsening, death and care requires constant monitoring of vital signs, hemodynamics,respiratory and cardiac monitoring, extensive review of multiple databases, frequent neurological assessment, discussion with family, other specialists and medical decision making of high complexity.I have made any additions or clarifications directly to the above note.This critical care time does not reflect procedure time, or teaching time or supervisory time of PA/NP/Med Resident etc but could involve care discussion time.  I spent 40 minutes of neurocritical care time  in the care of  this patient.      Delia Heady, MD Medical Director [facility] Pager: [phone] 04/24/2017 1:45 PM   To contact Stroke Continuity provider, please refer to [email or web address]. After hours, contact General Neurology

## 2017-04-24 NOTE — Progress Notes (Signed)
SLP Cancellation Note  Patient Details Name: Joseph Rubio MRN: [id number] DOB: 05-04-1966   Cancelled treatment:       Reason Eval/Treat Not Completed: Medical issues which prohibited therapy. Remains intubated. Signing off. Please re-consult if/when appropriate.   Ferdinand LangoLeah Kalei Meda MA, CCC-SLP [phone]    Jakeisha Stricker [name] 04/24/2017, 9:09 AM

## 2017-04-24 NOTE — Progress Notes (Signed)
PT Cancellation Note  Patient Details Name: Leonides GrillsJeffery L Bolander MRN: [id number] DOB: [birth year]   Cancelled Treatment:    Reason Eval/Treat Not Completed: Medical issues which prohibited therapy(pt remains ventilated on bedrest)   Fabio Asaevon J Kammy Klett 04/24/2017, 2:05 PM Charlotte Crumbevon Kristy Schomburg, PT DPT  Board Certified Neurologic Specialist [phone]

## 2017-04-24 NOTE — Progress Notes (Signed)
Patient transported from 4n29 to CT and back with no complications.

## 2017-04-24 NOTE — Progress Notes (Signed)
PULMONARY / CRITICAL CARE MEDICINE   Name: Joseph Rubio MRN: [id number] DOB: 30-Aug-1965    ADMISSION DATE:  2017-05-08 CONSULTATION DATE:  May 08, 2017  REFERRING MD:  Dr. Otelia Limes   CHIEF COMPLAINT:  CVA  Brief Patient Summary:  51 year old male with PMH of Asthma on no meds, presented to ED on 11/12 after being found down in shower;  last spoke to family/ LKN 2300 on 11/11.  Vomited w/EMS.  Upon arrival to ED patient verbally responsive with left side weakness and right gaze deviation. While in CT scanner patient became altered. Requiring intubation. CT Head with large posterior basal ganglia and thalamus hemorrhage on the right with intraventricular extension and mass effect upon the right half of the midbrain.  UDS positive for cocaine and THC, ETOH 40.  NSGY consulted for possible EVD drain, deferred due no change in hemorrhage or change in ventricles given x3 head CTs.    SUBJECTIVE:  Today now awakening and moving RUE and RLE, obeying commands. Somewhat agitated requiring increased sedation. Family is present in the room and is anxious about the patient waking up.  Triglycerides 189 No further runs of SVT Off cardene  VITAL SIGNS: BP (!) 156/88   Pulse 67   Temp (!) 100.9 F (38.3 C) (Oral)   Resp 14   Ht 5\' 8"  (1.727 m)   Wt 71.1 kg (156 lb 12 oz)   SpO2 100%   BMI 23.83 kg/m   VENTILATOR SETTINGS: Vent Mode: PRVC FiO2 (%):  [40 %-100 %] 100 % Set Rate:  [16 bmp] 16 bmp Vt Set:  [500 mL] 500 mL PEEP:  [5 cmH20] 5 cmH20 Plateau Pressure:  [14 cmH20-16 cmH20] 14 cmH20  INTAKE / OUTPUT: I/O last 3 completed shifts: In: 6683.3 [I.V.:4343.3; NG/GT:2340] Out: 3975 [Urine:3975]  PHYSICAL EXAMINATION: General:  Well nourished adult male, intubated, sedated, critically ill HEENT: PERRL sluggish, OP clear, MM moist, Orally intubated Neuro: RASS +2 to +3, eyes open, pulling at restraints, is redirectable and obeying commands on right to squeeze hand, give thumbs up, or  wiggle toes on right. Hemiparesis on left.  CV: tachycardic in 110's with a regular rhythm  PULM: CTA b/l, good breath sounds b/l GI: soft, bs  Extremities: warm/dry, trace BUE edema  Skin: no rashes  LABS:  BMET Recent Labs  Lab 2017/05/08 0040 2017-05-08 0055  May 08, 2017 1918 04/23/17 0146  04/24/17 0340 04/24/17 0717 04/24/17 1342  NA 137 140   < > 141 143  144   < > 148* 150* 152*  K 3.1* 3.2*  --  3.6 3.6  --  3.9  --   --   CL 107 109  --   --  119*  --  122*  --   --   CO2 14*  --   --   --  19*  --  21*  --   --   BUN 11 13  --   --  9  --  6  --   --   CREATININE 0.99 1.00  --   --  1.11  --  1.00  --   --   GLUCOSE 125* 123*  --   --  124*  --  158*  --   --    < > = values in this interval not displayed.   Electrolytes Recent Labs  Lab 05-08-2017 0040  04/23/17 0146 04/23/17 1425 04/24/17 0340  CALCIUM 8.5*  --  8.0*  --  8.5*  MG  --    < > 2.2 2.2 2.1  PHOS  --    < > 3.5 2.6 3.1   < > = values in this interval not displayed.   CBC Recent Labs  Lab 04/20/2017 0040 05/05/2017 0055 04/23/17 0146 04/24/17 0340  WBC 9.0  --  8.6 13.0*  HGB 12.0* 14.3 9.0* 8.7*  HCT 36.7* 42.0 28.7* 28.8*  PLT 313  --  271 296   Coag's Recent Labs  Lab 05/05/2017 0040  APTT 28  INR 1.23   Sepsis Markers Recent Labs  Lab 04/24/17 1342  LATICACIDVEN 0.9  PROCALCITON <0.10   ABG Recent Labs  Lab 04/14/2017 0235 04/23/17 0345 04/24/17 0329  PHART 7.329* 7.327* 7.316*  PCO2ART 29.3* 40.4 44.6  PO2ART 375* 122* 83.2   Liver Enzymes Recent Labs  Lab 04/15/2017 0040  AST 36  ALT 25  ALKPHOS 74  BILITOT 0.7  ALBUMIN 3.9   Cardiac Enzymes No results for input(s): TROPONINI, PROBNP in the last 168 hours.  Glucose Recent Labs  Lab 04/23/17 1614 04/23/17 1936 04/23/17 2330 04/24/17 0310 04/24/17 0802 04/24/17 1237  GLUCAP 129* 149* 162* 163* 149* 123*   Imaging Ct Angio Head W Or Wo Contrast  Result Date: 04/24/2017 CLINICAL DATA:  Follow-up exam for  intracranial hemorrhage. EXAM: CT ANGIOGRAPHY HEAD TECHNIQUE: Multidetector CT imaging of the head was performed using the standard protocol during bolus administration of intravenous contrast. Multiplanar CT image reconstructions and MIPs were obtained to evaluate the vascular anatomy. CONTRAST:  50mL ISOVUE-370 IOPAMIDOL (ISOVUE-370) INJECTION 76% COMPARISON:  Prior CT from 05/05/2017. FINDINGS: CT HEAD Brain: Intraparenchymal hematoma centered at the left thalamus is overall little interval changed measuring 4.5 x 2.7 x 3.8 cm (previously 4.5 x 2.8 x 3.7 cm). Surrounding low-density vasogenic edema relatively similar. Localized right-to-left midline shift measuring up to approximately 5 mm not significantly changed. Intraventricular extension with blood in mean lateral and third ventricles is stable to slightly decreased. Ventricular dilatation is relatively similar to previous. Basilar cisterns remain patent. No new intracranial hemorrhage. No other acute large vessel territory infarct. No mass lesion. No extra-axial fluid collection. Vascular: No hyperdense vessel. Skull: Scalp soft tissues and calvarium within normal limits. Sinuses: Chronic paranasal sinus disease, most evident within the ethmoidal air cells and left maxillary sinus. Remote posttraumatic defect noted at the right lamina for pre shift. Mastoid air cells are clear. Orbits: Globes and orbital soft tissues within normal limits. CTA HEAD Anterior circulation: Distal cervical segments of the internal carotid arteries are widely patent bilaterally. Petrous segments widely patent. Scattered atheromatous plaque throughout the cavernous/supraclinoid ICAs with moderate multifocal narrowing (up to approximately 50%). ICA termini widely patent. A1 segments patent bilaterally. Normal anterior communicating artery. Anterior cerebral arteries demonstrate multifocal atheromatous irregularity without high-grade stenosis. M1 segments demonstrate mild  atheromatous irregularity but are patent without flow-limiting stenosis. Normal MCA bifurcations. No proximal M2 occlusion. Distal MCA branches demonstrate atheromatous irregularity but are well perfused and symmetric. Posterior circulation: Vertebral artery is patent to the vertebrobasilar junction without stenosis. Posterior inferior cerebral arteries patent proximally. Basilar artery patent to its distal aspect without stenosis. Superior cerebral arteries patent bilaterally. Both of the posterior cerebral arteries primarily supplied via the basilar. PCAs demonstrate mild diffuse atheromatous regularity without flow-limiting stenosis. Venous sinuses: Grossly patent. Anatomic variants: None significant. No intracranial aneurysm. No vascular abnormality seen underlying the right thalamic hematoma. Delayed phase: No pathologic enhancement. IMPRESSION: 1. Overall little interval change in size and appearance of acute  intraparenchymal hematoma centered at the left thalamus with intraventricular extension and mild localized midline shift. No significant interval change in size and configuration of the ventricles. 2. Negative CTA with no vascular abnormality underlying the intraparenchymal hematoma identified. 3. Moderate carotid siphon atherosclerosis without high-grade flow-limiting stenosis. No hemodynamically significant or correctable stenosis within the intracranial circulation. Electronically Signed   By: Rise MuBenjamin  McClintock M.D.   On: 04/24/2017 05:21   Dg Chest Port 1 View  Result Date: 04/24/2017 CLINICAL DATA:  Acute respiratory failure with hypoxia ; left-sided pneumothorax. EXAM: PORTABLE CHEST 1 VIEW COMPARISON:  Portable chest x-ray of April 23, 2017 FINDINGS: There remains at approximately 10-15% left pneumothorax. Persistent retrocardiac density is present. There is small left pleural effusion. There are coarse lung markings in the right infrahilar region. The heart and pulmonary vascularity are  normal. The endotracheal tube tip projects 5.2 cm above the carina. The esophagogastric tube tip in proximal port project below the inferior margin of the image. IMPRESSION: Stable 10-15% left upper pneumothorax. Persistent bibasilar atelectasis or pneumonia greatest on the left. The support tubes are in reasonable position. Electronically Signed   By: David  SwazilandJordan M.D.   On: 04/24/2017 07:22   STUDIES:  CT Head 11/12 > 1. Acute hemorrhage centered in right thalamus, 21 cc. 2. Extension of hemorrhage into the ventricular system with large volume and right lateral ventricle and small volume within left lateral ventricle and third ventricle. 3. Mass effect with 5 mm right-to-left midline shift and minimal downward compression of the midbrain. 4. Mild hydrocephalus of lateral and third ventricles. CT C Spine 11/12 >  No acute fracture or dislocation of the cervical spine. CXR 11/12 > Endotracheal tube with tip appearing above the carina. Enteric tube extends into the left hemiabdomen with tip beyond the inferior margin of the image. CT head 11/12 at 1848 >> Unchanged intraparenchymal hematoma centered in the left thalamus with associated intraventricular extension and 3 mm of leftward midline shift. No new hemorrhage. Unchanged size and configuration of the ventricles CXR 11/13 > stable support apparatus; mild to mod left pnx- new; bibasilar changes CXR 11/13 noon >  Stable left pnx, mild basilar subsegmental atelectasis  CULTURES: None.   ANTIBIOTICS: None.   SIGNIFICANT EVENTS: 11/12 > Presents to ED, intubated, 3% 11/13 >  Left mild/mod pnx  LINES/TUBES: ETT 11/12 >> OGT 11/12  DISCUSSION: 51 year old male presents to ED with left sided weakness and right gaze deviation. CT Head with right thalamus hemorrhage with extension in the the ventricular system.  UDS + cocaine/TCH, ETOH 40  ASSESSMENT / PLAN:  Respiratory Insufficiency in setting of Acute CVA H/O Asthma - no home  meds Left mild to mod pneumothorax 11/13- likely from inhaled illegal drug abuse, spontaneous, +/- need for MV Tobacco Abuse Plan  - Continue full vent support - no SBT given pnx and ICH/ with cerebral edema - Continue FiO2 at 100% to promote reabsorption of pnx (DO NOT WEAN FIO2; it was weaned by RT overnight. Now back up to 100% FIO2 with sign on vent not to wean) - CXR on my review today shows a now small left PTX that is slightly improved from prior CXR - May need pigtail CT if worsens. Remains hemodynamically stable at this point and family not sure they want aggressive measures. Had made patient DNR and Do not escalate. Now with patient waking up more the family is not sure what they want and want more time to think about it.  -  repeat CXR and ABG in AM - VAP protocol - PPI for SUP  Aspiration pneumonia: - CXR shows bibasilar infiltrates on my review. Patient now has copious creamy secretions from ETT, and had fever of 102.7 overnight - obtain sputum culture; check procal and lactate.  - if fevers again obtain blood cultures - start zosyn (MRSA nares PCR negative so no need for Vanc)  HTN Crisis - + cocaine - BP 209/110 in ED >> now improved  - troponin x1 neg Plan  - Cardiac Monitoring  - Cardene prn for BP goal < 220/120 - TTE pending - no beta blockers given cocaine use, d/c labetalol - continue cautiously w/ norvasc and HCTZ while on continuous sedation to prevent acute hypotension  Acute Hemorrhage -INR 15.4, APTT 28 Plan  -Trend CBC  -Hold all anticoagulation -SCDs only    Acute Encephalopathy secondary to Acute CVA  Right Thalamus Acute Hemorrhage with extension into the ventricular system with 5 mm right to left midline shift  ETOH, cocaine, and THC abuse - admission ETOH Level 40  Plan  -  Neurology Primary -  NGSY following, EVD not indicated at this point given stable bleed/ ventricles  -  Frequent Neuro Checks   -  MRI/MRA ordered  -  Continue 3% saline  @ 70cc/hr; if requires >75cc/hr will need CVC  - RASS goal 0/-1  - off propofol given high triglycerides, continue with fentanyl gtt and precedex gtt; add versed IV PRN given agitation today - PAD protocol with bowel regimen - continue MVI, thiamine, and folate  FAMILY  - Updates: Son at bedside and multiple other family members at bedside today. Son wanted to be updated in private. On updating son he said he is confused as he thought patient was dying and now is waking up. Explained active medical issues again to son and clarified that awakening, while a good sign, doesn't mean that the patient is better. Son still not sure if patient would want trach/peg if needed or if would even want rehab and if would want to live with hemiparesis. He is on of 6 children. 5 of those children are adults. Of his 4 adult siblings, 3 brothers have plans to arrive in ICU today. Will need to set up family meeting with all of these siblings (the patient's children), sometime later this week if possible.  - Inter-disciplinary family meet or Palliative Care meeting due by: 04/29/2017   DVT prophylaxis: SCDs SUP: PPI Diet: NPO/ TF  Activity: bedrest Disposition : ICU   60 minutes critical care time  Milana Obey, MD Pulmonary & Critical Care Medicine Pager: [phone]

## 2017-04-24 NOTE — Progress Notes (Signed)
   04/24/17 1100  Clinical Encounter Type  Visited With Family  Visit Type Follow-up  Consult/Referral To Chaplain  Spiritual Encounters  Spiritual Needs Grief support  Chaplain had conversation with son of PT.  PT began showing signs of movement that alarmed the son.  Dr came into room to consult.  Chaplain continued providing spiritual and emotional support to son of PT.  Family meeting is supposed to take place today.

## 2017-04-24 NOTE — Progress Notes (Signed)
Pharmacy Antibiotic Note  Joseph Rubio is a 51 y.o. male admitted on 04/30/2017 with pneumonia.  Pharmacy has been consulted for Zosyn dosing. Tmax/24h 102.7, WBC up 13, SCr 1 stable, CrCl~8RichardeanCarlyn9VLarita FiLajoyce Co26rRussell County HospitalDevoMazzocco Ambulatory 78Reuel BLSherlKoG63arDenm(61GodfMiami Va Medical CenteDaAngeli4684RichardeanCLarita FiLajoyce Co79rSentara Careplex HospitalDevoSouthwest Idaho Sur78Reuel BLSherlKoG43DenmGodfFallbrook Hospital Distri7760cJaCorrie MckuTenRichardeanCarlLarita FiLajoyce Co54rSouth Florida Baptist HospitalDevoMiddlesex78Reuel BDenm4RichardeanCarlLarita FiLajoyce Co1rWashington Health GreeneDevoBaylor Scott And White Sports SurgeRichardeanCarlyn1ValMLarita FiLajoyce Co3rMease Dunedin HospitalDevoHamilton Eye Institute Su78Reuel BLSherlKDenm(318GodfRehabilitation Hospital OfRichardeanCarlynLarita FiLajoyce Co40rVista Surgery Center LLCDevoCaguas Ambulatory Surg78Reuel BLSDenm(505GodfLakewood RichardeanCarlLarita FiLajoyce Co54rSparrow Health System-St Lawrence CampusDevoUniversity HospiDenm(RichardeanCarlyn3VLarita FiLajoyce Co78rGastroenterology Of Canton Endoscopy Center Inc Dba Goc Endoscopy CenterDevoSoldiers And Sailors Me78Reuel BLSheDenmGodfThe Matheny Medical And Educational CenteDaPipeline Westlake Hospit5639aJRichardeanCarlyn5ValMRLarita FiLajoyce Co69rKinston Medical Specialists PaDevoThe Heart And Vascular78Reuel BLSherlKoG78areCl45oDenm40GodfSan6449fJaCorrie RichardeanCarlynLarita FiLajoyce Co47rCallahan Eye HospitalDevoAdvance Endos78Reuel BLSherlKoG52areCl64Denm(85GodfSurgery Center Of WestRichardeanCarlLarita FiLajoyce Co46rFairlawn Rehabilitation HospitalDevoSouthwest [facility] - 78Reuel BLShDenm(220GodfOpticare ERichardeanCarlynLarita FiLajoyce Co53rSanta Barbara Endoscopy Center LLCDev78Reuel BDenm26GodfBig Sky Surgery Center LL4304DJaCorrie MckuTenny CrZ626X09Dorot50eNinetta Li<MEASUREMEBoston RichardeanCarlLarita FiLajoyce Co33rUc RegentsDevoSanford Tracy78Reuel BLSherlKoGDenm(42GodfSelect Specialty Hospital - PaRichardeanCarlynLarita FiLajoyce Co58rKelsey Seybold Clinic Asc SpringDevoColumbia E78Reuel BLSherlKoG9areCl46ovHattiesDenm66GodfHosp San FranciRichardeanCarly2Larita FiLajoyce Co8rLindenhurst Surgery Center LLCDevoTennova Healthcare North Knoxville78Reuel BLSherlKoG80ar5795eJaCorRichardeaCarlyn5ValMEast HLarita FiLajoyce Co76rAirport Endoscopy CenterDevoSoutheasthealth Center Of 78Reuel BLSherlDRichardeanCarlyn4VLarita FiLajoyce Co54rShasta Regional Medical CenterDevoMercy Medical Ce78Reuel BLSherlKoG24Denm78GodfNorth Mem6RichardeanCarly1VaLarita FiLajoyce Co85rThe Surgery Center Of Alta [facility] Summit Medical Center LLCDevoChildrens Medic78Reuel BLSherlKoG52areCl42ovNortheast NebraDenm78GodfSunbury Community HospitaD572aJaCorriRichardeanCarlyn3ValMLaLarita FiLajoyce Co17rCharles A Dean Memorial HospitalDevoSpecialty Surgical Cente78ReDenm20GodfSelect Spec3RichardeaCarlyn4ValMBuLarita FiLajoyce Co51rSanford Canby Medical CenterDevoBanner Good Samaritan78ReueDenm31GodRichardeanCarlLarita FiLajoyce Co61rThe Surgery Center Dba Advanced Surgical CareDevoCandler 78Reuel BLSherlKoG75areCl71ovUniversity Denm95GodfThe Surgery Center At Pointe Wes169DJaCorrie MckuTenny CrZ672X09DorotRichardeanCarlyn7ValMLarita FiLajoyce Co94rParkview Regional Medical CenterDevoFresno EDenm(RichardeanCarLarita FiLajoyce Co35rUcsf Benioff Childrens Hospital And Research Ctr At OaklandDevoCampbellton-Grac78Reuel BLSherlKoG14areDenm(931GodfSurgical Insti3221tRichardeanCarlynLarita FiLajoyce Co39rEureka Springs HospitalDevoSa78Reuel BLSherlKoG29areCl56ovDenm56842GJaCorrie MckuTenny CrZ675X09Dorot18eNinettaRichardeanCarlyn8VLarita FiLajoyce Co27rCoastal Digestive Care Center LLCDevoChristus Spohn78Reuel BLSherlKoG50areCl66ovLafayetDenm(74GodfGuadalupe Regional Medical Ce5250nJaCorrie MckuTenRichardeanCarlyn1VLarita FiLajoyce Co45rH. C. Watkins Memorial HospitalDevoSuperior Endosco78Reuel BLSherlKoG29areCl50ovSurgical Center Of Southfield LLCDenm41GodfMaryland676 JaCorrie MckuTeRichardeanCarlynLarita FiLajoyce Co18rExecutive Surgery Center IncDevoMiami 78Reuel BLSherlKoG42areCl67ovSanDenm94GodfSurgRichardeanCarlyn3Larita FiLajoyce Co81rMason Ridge Ambulatory Surgery Center Dba Gateway Endoscopy CenterDevoVa Medical Center - H.7Denm20GodfBaptist Memorial RehabilitaRichardeanCarlyn6VLarita FiLajoyce Co78rSan Fernando Valley Surgery Center LPDevoChristus Ochsner Lake Area78Reuel BLSherlKoG16areCl73ovShore Ambulatory RichardeanCarlyn3Larita FiLajoyce Co16rSt Francis HospitalDevoDekalb Endoscopy Center LLC Dba Dekalb E78Reuel BLSherlKoG53Denm(240GodfSan Antonio Gastroenterology Endoscopy Center M5394eJaCorriRichardeanCarlyn6VaLarita FiLajoyce Co28rMontclair Hospital Medical CenterDevoDoctors Outpatient Center 78Reuel BLSherlKoG44areCl26ovCharlotte Surgery Center LLC Dba Charlotte Surgery Center Museum CDenm60GodfFort Hamilton Hughes RichardeanCarlLarita FiLajoyce Co22rKindred Hospital Town & CountryDevoTidelands Waccamaw Com78Reuel BLSherlKoG57areCl50ovSouth Florida Evaluation And Treatment CentDenm44G7360oJaCorrie MckuTenny CrZ643X09Dorot62eNinetta Li<MEASUREMESt Charles Hospital And Rehabilitat81io7RichardeanLajoyce Co14rOrseshoe Surgery Center LLC Dba Lakewood Surgery CenterDevoBay Area Regional78Reuel BLShG3432University Orthopedics East Bay Surgery CenteMethodist Surgery Center Germantown LPrarDenm(51GodfAdventist Healthcare WashingtonRichardeanCarlyn3ValLarita FiLajoyce Co72rW.J. Mangold Memorial HospitalDevoMilton S Hershey78ReuelDenm93GodfAmerican EndoscopRichardeanCarlyn4VLarita FiLajoyce Co92rBarnes-Jewish HospitalDevoIsland Ambulatory78Reuel BLSherlKoG35areCDenm50GodfNovant Health Haymarket Ambulatory Surgic6607aJaCRichardeanCarlynValLarita FiLajoyce Co23rAdventhealth Matanuska-Susitna ChapelDevoMuenster Me78Reuel BLShDenm(917GodfMetropolRichardeanCarlynValMPlLarita FiLajoyce Co62rPhillips County HospitalDevoCanyon Vista78Reuel BL5176SJaCorRichardeanCarlyn2ValLarita FiLajoyce Co23rNoland Hospital AnnistonDevoBrand Sur78Reuel BLSherlKoG70arDenm47GodfSt. John'S Riverside Hospital - Dobbs FerrDaCommuni7750tJaCorrie MckuTenny CrZ661X09Dorot6eNinetta Li<MEASUR81Richar62dFort Washington Surgery Center LLCDevoSt Reuel BLSh43G8Mayo Clinic [facility] Dba Mayo Clinic Jacksonville Asc For G Citrus Endoscopy CenterDenm(66GodfAmery Hospital And CliniC9Producer, telRichardeanCarlyn3ValMWestLarita FiLajoyce Co60rWildwood Lifestyle Center And HospitalDevoMarietta Advanced78ReDenm40GodfThe PolycliniDaMc6176cJaCorrie McRichardeanCarlyn8ValLarita FiLajoyce Co8rGriffin Memorial HospitalDevoMilford Regional78ReDenmGodfRed River Behavioral H393eJRichardeanCarlyn4VLarita FiLajoyce Co84rMemorial Hospital Of Sweetwater CountyDevoWhiting Fo78Reuel BLSheDenmGodfCoordinat6165RichardeanCarlLarita FiLajoyce Co59rTmc Bonham HospitalDevoAce Endoscopy And78Reuel BLSherlKoG74RichardeanCarlyn3ValMLarita FiLajoyce Co68rTowson Surgical Center LLCDevoBellin Health Marinette78Reuel BLSherlKoG26aDenmGRichardeanCarlyn3VaLarita FiLajoyce Co55rCuero Community HospitalDevoMethodist Mansfield78Reuel Denm(50GodfThosand Oaks Surgery CenteDaBlue Island Hospital Co LLC Dba MRichardeanCarlynLarita FiLajoyce Co77rOlathe Medical CenterDevoCpc Hosp San 78Reuel BLSherlKoG41areCl69ovBeltway DenmGodfMedical Center 6411SJaCorrie MckuTeRichardeanCarlyn4ValMHill CouLarita FiLajoyce Co69rBaylor Scott And White Texas Spine And Joint HospitalDevoBloomington M78RDenmGodfGuRichardeanCarlLarita FiLajoyce Co79rClark Fork Valley HospitalDevoLittle H78Reuel BLShDenm60GodfBaRichardeanCarlLarita FiLajoyce Co61rNew York-Presbyterian/Lawrence HospitalDevoColumbia Eye Sur78Reuel BLSherlKoG73areCl99ovLgh A Golf ADenm81GodfBaptist Memorial ReRichardeanCarlyn 6VaLarita FiLajoyce Co72rRivendell Behavioral Health ServicesDevoSunnyview RehabiDenmGodfSt Vincent HospitaDaRichardeanCarlLarita FiLajoyce Co17rThe Surgery Center Of The Villages LLCDevoMemorial Regional7Denm(91GodfBelmont Center ForRichardeanCarlyn2ValMPlLarita FiLajoyce Co64rMilford HospitalDevoKindred H78ReuDenm51GodfJefferson RegiRichardeanCarlyn8LariRichardeanCarlLarita FiLajoyce Co80rCrown Point Surgery CenterDevoMidland Me78Reuel BLSherlKoG33areClDenm76GodfHarney District HoRichardeanCarlyn7Larita FiLajoyce Co28rMilestone Foundation - Extended CareDevoCenter For Beha78Reuel BLSheDenm40GodfColumbia Eye Surgery RichardeanCarlyLarita FiLajoyce Co40rBaylor University Medical CenterDevoGreat RiDenm40GodfCornerstone Speciality HospitRichardeanCarlyn2VaLarita FiLajoyce Co10rArkansas Heart HospitalDevoMckee78Reuel BLSherlKoG5areClDenm(21GodfLoyola Ambulatory Surgery Center At Oakbrook L5633DJaCorrie MckuTenny CrZ615X09Dorot40eNinetta Li<MEASUR97Richar4dKing'S Daughters' HealthDevoAnna Hospital Corporation - Dba UnionReuel BLShG9137West Covina Medical CentePacific Rim Outpatient Surgery CenterDenm58GodfOak Lawn EndoscopCy47Producer, televRichardeanCaLarita FiLajoyce Co23rSouth Peninsula HospitalDevoNyu Winthrop-Univ78Reuel BRichardeanCarlyn6VaLarita FiLajoyce Co7rBrass Partnership In Commendam Dba Brass Surgery CenterDevoThomas EyDenm72GodfPatient’S Choice Medical Center Of Humphrey3680sJaCorrie MckuTenny CrZ690X09Dorot34eNinetta LighSherian Reinene Regional Medical CenUCProducer, televiMonmouth Medical Cente(346)Adverti >ion plan/LOT   Height: 5\' 8"  (172.7 cm) Weight: 156 lb 12 oz (71.1 kg) IBW/kg (Calculated) : 68.4  Temp (24hrs), Avg:99.7 F (37.6 C), Min:98.1 F (36.7 C), Max:102.7 F (39.3 C)  Recent Labs  Lab 04/30/2017 0040 04/20/2017 0055 04/23/17 0146 04/24/17 0340  WBC 9.0  --  8.6 13.0*  CREATININE 0.99 1.00 1.11 1.00    Estimated Creatinine Clearance: 86.5 mL/min (by C-G formula based on SCr of 1 mg/dL).    Not on File  Yittel Emrich, PharmD, BCPS Clinical Pharmacist 04/24/2017 12:58 PM

## 2017-04-25 ENCOUNTER — Inpatient Hospital Stay (HOSPITAL_COMMUNITY): Payer: Medicaid Other

## 2017-04-25 DIAGNOSIS — J181 Lobar pneumonia, unspecified organism: Secondary | ICD-10-CM

## 2017-04-25 LAB — BLOOD GAS, ARTERIAL
ACID-BASE DEFICIT: 1.3 mmol/L (ref 0.0–2.0)
BICARBONATE: 23.5 mmol/L (ref 20.0–28.0)
Drawn by: 345601
FIO2: 100
O2 Saturation: 99.4 %
PATIENT TEMPERATURE: 98.6
PCO2 ART: 43.2 mmHg (ref 32.0–48.0)
PEEP: 5 cmH2O
RATE: 16 resp/min
VT: 500 mL
pH, Arterial: 7.354 (ref 7.350–7.450)
pO2, Arterial: 157 mmHg — ABNORMAL HIGH (ref 83.0–108.0)

## 2017-04-25 LAB — CBC WITH DIFFERENTIAL/PLATELET
BASOS PCT: 0 %
Basophils Absolute: 0 10*3/uL (ref 0.0–0.1)
Eosinophils Absolute: 0.2 10*3/uL (ref 0.0–0.7)
Eosinophils Relative: 1 %
HCT: 28.6 % — ABNORMAL LOW (ref 39.0–52.0)
HEMOGLOBIN: 8.5 g/dL — AB (ref 13.0–17.0)
Lymphocytes Relative: 20 %
Lymphs Abs: 2.8 10*3/uL (ref 0.7–4.0)
MCH: 29 pg (ref 26.0–34.0)
MCHC: 29.7 g/dL — AB (ref 30.0–36.0)
MCV: 97.6 fL (ref 78.0–100.0)
MONOS PCT: 7 %
Monocytes Absolute: 1 10*3/uL (ref 0.1–1.0)
NEUTROS PCT: 72 %
Neutro Abs: 9.9 10*3/uL — ABNORMAL HIGH (ref 1.7–7.7)
Platelets: 281 10*3/uL (ref 150–400)
RBC: 2.93 MIL/uL — ABNORMAL LOW (ref 4.22–5.81)
RDW: 15.8 % — ABNORMAL HIGH (ref 11.5–15.5)
WBC: 13.9 10*3/uL — ABNORMAL HIGH (ref 4.0–10.5)

## 2017-04-25 LAB — GLUCOSE, CAPILLARY
GLUCOSE-CAPILLARY: 162 mg/dL — AB (ref 65–99)
GLUCOSE-CAPILLARY: 157 mg/dL — AB (ref 65–99)
Glucose-Capillary: 134 mg/dL — ABNORMAL HIGH (ref 65–99)
Glucose-Capillary: 115 mg/dL — ABNORMAL HIGH (ref 65–99)
Glucose-Capillary: 103 mg/dL — ABNORMAL HIGH (ref 65–99)
Glucose-Capillary: 139 mg/dL — ABNORMAL HIGH (ref 65–99)

## 2017-04-25 LAB — BASIC METABOLIC PANEL
ANION GAP: 6 (ref 5–15)
BUN: 7 mg/dL (ref 6–20)
CALCIUM: 8.5 mg/dL — AB (ref 8.9–10.3)
CHLORIDE: 123 mmol/L — AB (ref 101–111)
CO2: 24 mmol/L (ref 22–32)
Creatinine, Ser: 0.96 mg/dL (ref 0.61–1.24)
GFR calc non Af Amer: >60 mL/min (ref >60)
Glucose, Bld: 163 mg/dL — ABNORMAL HIGH (ref 65–99)
POTASSIUM: 3.4 mmol/L — AB (ref 3.5–5.1)
Sodium: 153 mmol/L — ABNORMAL HIGH (ref 135–145)

## 2017-04-25 LAB — URINALYSIS, ROUTINE W REFLEX MICROSCOPIC
Bilirubin Urine: NEGATIVE
GLUCOSE, UA: 50 mg/dL — AB
Hgb urine dipstick: NEGATIVE
KETONES UR: NEGATIVE mg/dL
LEUKOCYTES UA: NEGATIVE
NITRITE: NEGATIVE
PH: 5 (ref 5.0–8.0)
PROTEIN: NEGATIVE mg/dL
Specific Gravity, Urine: 1.017 (ref 1.005–1.030)

## 2017-04-25 LAB — SODIUM
SODIUM: 154 mmol/L — AB (ref 135–145)
SODIUM: 156 mmol/L — AB (ref 135–145)
Sodium: 154 mmol/L — ABNORMAL HIGH (ref 135–145)
Sodium: 155 mmol/L — ABNORMAL HIGH (ref 135–145)

## 2017-04-25 LAB — TRIGLYCERIDES: Triglycerides: 90 mg/dL (ref <150)

## 2017-04-25 LAB — PROCALCITONIN

## 2017-04-25 MED ORDER — PROPOFOL 1000 MG/100ML IV EMUL
INTRAVENOUS | Status: AC
Start: 2017-04-25 — End: 2017-04-25
  Filled 2017-04-25: qty 100

## 2017-04-25 MED ORDER — POTASSIUM CHLORIDE 10 MEQ/100ML IV SOLN
10.0000 meq | INTRAVENOUS | Status: AC
  Administered 2017-04-25 (×4): 10 meq via INTRAVENOUS
  Filled 2017-04-25 (×4): qty 100

## 2017-04-25 MED ORDER — SUCCINYLCHOLINE CHLORIDE 20 MG/ML IJ SOLN: 5.0000 mg | Freq: Once | INTRAMUSCULAR | Status: DC

## 2017-04-25 MED ORDER — PROPOFOL 1000 MG/100ML IV EMUL
5.0000 ug/kg/min | INTRAVENOUS | Status: DC
  Administered 2017-04-25: 30 ug/kg/min via INTRAVENOUS

## 2017-04-25 MED ORDER — ETOMIDATE 2 MG/ML IV SOLN
30.0000 mg | Freq: Once | INTRAVENOUS | Status: AC
  Administered 2017-04-25: 30 mg via INTRAVENOUS

## 2017-04-25 MED ORDER — FENTANYL BOLUS VIA INFUSION
100.0000 ug | Freq: Once | INTRAVENOUS | Status: AC
  Administered 2017-04-25: 100 ug via INTRAVENOUS

## 2017-04-25 MED FILL — Medication: Qty: 1 | Status: AC

## 2017-04-25 NOTE — Progress Notes (Signed)
STROKE TEAM PROGRESS NOTE   HISTORY OF PRESENT ILLNESS (per record)  Joseph Rubio is an 51 y.o. male who was LKN at 2300 on Sunday night per EMS. He was left at home to babysit a child. When family returned, he was found down on the floor of the bathroom naked near the shower. Family suspected he had collapsed after getting out of the shower. He was found at 12:07 AM. He vomited at home prior to EMS arrival. En route they noted forced gaze deviation to the right with left sided weakness. On arrival to the ED he was able to speak to staff. During CT patient became altered. CT head showed a large posterior basal ganglia and thalamus hemorrhage on the right with intraventricular extension and mass effect upon the right half of the midbrain. He was able to state his PMHx to EMS en route as consisting of asthma only. He denied being on any medications Patient was not administered IV t-PA secondary to intracerebral hemorrhage He was admitted to the neuro ICU for further evaluation and treatment.   SUBJECTIVE (INTERVAL HISTORY) His  Sister from [place] is at the bedside. Marland Kitchen He remains agitated requiring sedation and has purposeful movements on the right but plegic on the left. Blood pressure is controlled on Cardene drip  Hypertonic saline drip has been started and sodium is 153-154  . He remains febrile and is on antibiotics. He self extubated himself this morning but had respiratory distress and has been reintubated.  OBJECTIVE Temp:  [98.8 F (37.1 C)-101.6 F (38.7 C)] 101.6 F (38.7 C) (11/15 0800) Pulse Rate:  [48-110] 49 (11/15 1400) Cardiac Rhythm: Normal sinus rhythm (11/15 0800) Resp:  [12-28] 16 (11/15 1400) BP: (115-172)/(69-97) 118/73 (11/15 1400) SpO2:  [95 %-100 %] 100 % (11/15 1400) FiO2 (%):  [100 %] 100 % (11/15 1211) Weight:  [157 lb 10.1 oz (71.5 kg)] 157 lb 10.1 oz (71.5 kg) (11/15 0500)  CBC:  Recent Labs  Lab 04/24/17 0340 04/25/17 0257  WBC 13.0* 13.9*  NEUTROABS 10.5*  9.9*  HGB 8.7* 8.5*  HCT 28.8* 28.6*  MCV 97.0 97.6  PLT 296 281    Basic Metabolic Panel:  Recent Labs  Lab 04/23/17 1425  04/24/17 0340  04/25/17 0257 04/25/17 0735  NA 147*   < > 148*   < > 153*  154* 154*  K  --   --  3.9  --  3.4*  --   CL  --   --  122*  --  123*  --   CO2  --   --  21*  --  24  --   GLUCOSE  --   --  158*  --  163*  --   BUN  --   --  6  --  7  --   CREATININE  --   --  1.00  --  0.96  --   CALCIUM  --   --  8.5*  --  8.5*  --   MG 2.2  --  2.1  --   --   --   PHOS 2.6  --  3.1  --   --   --    < > = values in this interval not displayed.    Lipid Panel:     Component Value Date/Time   TRIG 90 04/25/2017 0735   HgbA1c:  Lab Results  Component Value Date   HGBA1C 4.9 04/12/2017   Urine Drug Screen:  Component Value Date/Time   LABOPIA NONE DETECTED 04/24/2017 0615   COCAINSCRNUR POSITIVE (A) 04/12/2017 0615   LABBENZ NONE DETECTED 04/21/2017 0615   AMPHETMU NONE DETECTED 05/06/2017 0615   THCU POSITIVE (A) 04/30/2017 0615   LABBARB NONE DETECTED 04/26/2017 0615    Alcohol Level     Component Value Date/Time   ETH 40 (H) 04/27/2017 0133    IMAGING  Ct Angio Head W Or Wo Contrast  Result Date: 04/24/2017 CLINICAL DATA:  Follow-up exam for intracranial hemorrhage. EXAM: CT ANGIOGRAPHY HEAD TECHNIQUE: Multidetector CT imaging of the head was performed using the standard protocol during bolus administration of intravenous contrast. Multiplanar CT image reconstructions and MIPs were obtained to evaluate the vascular anatomy. CONTRAST:  50mL ISOVUE-370 IOPAMIDOL (ISOVUE-370) INJECTION 76% COMPARISON:  Prior CT from 05/09/2017. FINDINGS: CT HEAD Brain: Intraparenchymal hematoma centered at the left thalamus is overall little interval changed measuring 4.5 x 2.7 x 3.8 cm (previously 4.5 x 2.8 x 3.7 cm). Surrounding low-density vasogenic edema relatively similar. Localized right-to-left midline shift measuring up to approximately 5 mm not  significantly changed. Intraventricular extension with blood in mean lateral and third ventricles is stable to slightly decreased. Ventricular dilatation is relatively similar to previous. Basilar cisterns remain patent. No new intracranial hemorrhage. No other acute large vessel territory infarct. No mass lesion. No extra-axial fluid collection. Vascular: No hyperdense vessel. Skull: Scalp soft tissues and calvarium within normal limits. Sinuses: Chronic paranasal sinus disease, most evident within the ethmoidal air cells and left maxillary sinus. Remote posttraumatic defect noted at the right lamina for pre shift. Mastoid air cells are clear. Orbits: Globes and orbital soft tissues within normal limits. CTA HEAD Anterior circulation: Distal cervical segments of the internal carotid arteries are widely patent bilaterally. Petrous segments widely patent. Scattered atheromatous plaque throughout the cavernous/supraclinoid ICAs with moderate multifocal narrowing (up to approximately 50%). ICA termini widely patent. A1 segments patent bilaterally. Normal anterior communicating artery. Anterior cerebral arteries demonstrate multifocal atheromatous irregularity without high-grade stenosis. M1 segments demonstrate mild atheromatous irregularity but are patent without flow-limiting stenosis. Normal MCA bifurcations. No proximal M2 occlusion. Distal MCA branches demonstrate atheromatous irregularity but are well perfused and symmetric. Posterior circulation: Vertebral artery is patent to the vertebrobasilar junction without stenosis. Posterior inferior cerebral arteries patent proximally. Basilar artery patent to its distal aspect without stenosis. Superior cerebral arteries patent bilaterally. Both of the posterior cerebral arteries primarily supplied via the basilar. PCAs demonstrate mild diffuse atheromatous regularity without flow-limiting stenosis. Venous sinuses: Grossly patent. Anatomic variants: None significant. No  intracranial aneurysm. No vascular abnormality seen underlying the right thalamic hematoma. Delayed phase: No pathologic enhancement. IMPRESSION: 1. Overall little interval change in size and appearance of acute intraparenchymal hematoma centered at the left thalamus with intraventricular extension and mild localized midline shift. No significant interval change in size and configuration of the ventricles. 2. Negative CTA with no vascular abnormality underlying the intraparenchymal hematoma identified. 3. Moderate carotid siphon atherosclerosis without high-grade flow-limiting stenosis. No hemodynamically significant or correctable stenosis within the intracranial circulation. Electronically Signed   By: Rise MuBenjamin  McClintock M.D.   On: 04/24/2017 05:21   Dg Chest Port 1 View  Result Date: 04/25/2017 CLINICAL DATA:  Acute respiratory failure. EXAM: PORTABLE CHEST 1 VIEW COMPARISON:  Chest x-ray from same date at 09:11. FINDINGS: Endotracheal tube in good position with the tip approximately 3.5 cm above the level of the carina. Enteric tube is unchanged in position. The cardiomediastinal silhouette is normal in size. Trace left apical  pneumothorax is unchanged. Unchanged right basilar atelectasis. Increased density in the left lower lobe. No pleural effusion. No acute osseous abnormality. IMPRESSION: 1. Increased consolidation in the left lower lobe, which could reflect pneumonia or atelectasis. Unchanged right basilar atelectasis. 2. Stable trace left apical pneumothorax. Electronically Signed   By: Obie Dredge M.D.   On: 04/25/2017 10:12   Dg Chest Port 1 View  Result Date: 04/25/2017 CLINICAL DATA:  Shortness of breath. EXAM: PORTABLE CHEST 1 VIEW COMPARISON:  Radiograph April 24, 2017. FINDINGS: Stable cardiomediastinal silhouette. Endotracheal and nasogastric tubes are unchanged in position. Minimal left apical pneumothorax is noted which is significantly improved compared to prior exam. Stable  right basilar atelectasis or infiltrate is noted. No significant pleural effusion is noted. Bony thorax is unremarkable. IMPRESSION: Stable support apparatus. Minimal left apical pneumothorax is noted which is improved compared to prior exam. Stable right basilar atelectasis or infiltrate. Electronically Signed   By: Lupita Raider, M.D.   On: 04/25/2017 09:40   Dg Chest Port 1 View  Result Date: 04/24/2017 CLINICAL DATA:  Acute respiratory failure with hypoxia ; left-sided pneumothorax. EXAM: PORTABLE CHEST 1 VIEW COMPARISON:  Portable chest x-ray of April 23, 2017 FINDINGS: There remains at approximately 10-15% left pneumothorax. Persistent retrocardiac density is present. There is small left pleural effusion. There are coarse lung markings in the right infrahilar region. The heart and pulmonary vascularity are normal. The endotracheal tube tip projects 5.2 cm above the carina. The esophagogastric tube tip in proximal port project below the inferior margin of the image. IMPRESSION: Stable 10-15% left upper pneumothorax. Persistent bibasilar atelectasis or pneumonia greatest on the left. The support tubes are in reasonable position. Electronically Signed   By: David  [place] M.D.   On: 04/24/2017 07:22   Dg Chest Port 1 View  Result Date: 04/23/2017 CLINICAL DATA:  Follow-up pneumothorax EXAM: PORTABLE CHEST 1 VIEW COMPARISON:  04/23/2017, 04/13/2017 FINDINGS: Endotracheal tube tip is about 3 cm superior to the carina. Esophageal tube tip is below the diaphragm. Persistent dense left lower lobe consolidation. Right infrahilar atelectasis or infiltrate, no change. Stable cardiomediastinal silhouette. Re- demonstrated left apical, lateral, and basilar pneumothorax without significant interval change IMPRESSION: 1. Stable mild to moderate left pneumothorax 2. No change in left lower lobe consolidation and right infrahilar atelectasis or infiltrate. Electronically Signed   By: Jasmine Pang M.D.   On:  04/23/2017 18:53    CT angio brain 04/24/2017 : 1. Overall little interval change in size and appearance of acute intraparenchymal hematoma centered at the left thalamus with intraventricular extension and mild localized midline shift. No significant interval change in size and configuration of the ventricles. 2. Negative CTA with no vascular abnormality underlying the intraparenchymal hematoma identified. 3. Moderate carotid siphon atherosclerosis without high-grade flow-limiting stenosis. No hemodynamically significant or correctable stenosis within the intracranial circulation   PHYSICAL EXAM Middle-aged African-American male who is sedated and intubated. . Afebrile. Head is nontraumatic. Neck is supple without bruit.    Cardiac exam no murmur or gallop. Lungs are clear to auscultation. Distal pulses are well felt. Neurological Exam : Patient is sedated and intubated. Eyes are closed. He opens eyes to commands. Follows few commands on the right side. Pupils are small 2 mm sluggishly reactive. There is skew eye deviation with right eye downward and outward. Corneal reflexes are preserved. Dolls eye moments are sluggish. Cough and gag reflexes are present. He does have respiratory effort above ventilator settings he moves right upper and lower  extremity semi-purposefully to sternal rub. No movement in the left upper extremity and trace movement in the left lower extremity to painful stimuli. Tone is normal on the right and decreased on the left. Left plantar upgoing right downgoing. ASSESSMENT/PLAN Mr. Leonides GrillsJeffery L Puskarich is a 51 y.o. 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 Again noted is intraventricular extension with mild hydrocephalus. 3 mm RIGHT to LEFT midline shift is similar. Narrowed though patent basal cisterns. 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 No acute large vascular territory infarcts  MRI head not ordered  MRA head not ordered  Carotid Doppler  not ordered  2D Echo  pending  LDL unable to calculate  HgbA1c pending   SCDs for VTE prophylaxis Diet NPO time specified  No antithrombotic prior to admission, now on No antithrombotic due to ICH  Therapy recommendations:  On hold Disposition:  pending Hypertension  Unstable  Permissive hypertension (OK if < 220/120) but gradually normalize in 5-7 days  Long-term BP goal normotensive  Hyperlipidemia  Home meds:  none  LDL cannot calculate goal < 70  Diabetes  HgbA1c pending goal < 7.0   Other Active Problems  Respiratory failure  Cocaine and marijuana abuse  Hospital day # 3  I have personally examined this patient, reviewed notes, independently viewed imaging studies, participated in medical decision making and plan of care.ROS completed by me personally and pertinent positives fully documented  I have made any additions or clarifications directly to the above note.  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 His prognosis is guarded. Continue strict blood pressure control with the systolic blood pressure goal below   below 160. Hypertonic saline for cytotoxic edema with sodium goal 150-155.  Continue intubation.  I had a long discussion with the patient's sister and discuss his prognosis and plan of care. She is realistic but wants us to continue supportive care for the next few days but by early next week she and  his son will make decision on tracheostomy and PEG tube versus withdrawal of care.. . Discuss with Dr. Merlene PullingHammonds from critical care medicine This patient is critically ill and at significant risk of neurological worsening, death and care requires constant monitoring of vital signs, hemodynamics,respiratory and cardiac monitoring, extensive review of multiple databases, frequent neurological assessment, discussion with family, other specialists and medical decision making of high complexity.I have made any additions or clarifications directly to the above note.This critical care time does not reflect procedure time, or teaching time or supervisory time of PA/NP/Med Resident etc but could involve care discussion time.  I spent 35 minutes of neurocritical care time  in the care of  this patient.     Delia HeadyPramod Sethi, MD Medical Director [facility] Pager: [phone] 04/25/2017 3:15 PM   To contact Stroke Continuity provider, please refer to [email or web address]. After hours, contact General Neurology

## 2017-04-25 NOTE — Progress Notes (Addendum)
PT Cancellation Note/ Discharge  Patient Details Name: Joseph Rubio MRN: [id number] DOB: 01/02/1966   Cancelled Treatment:    Reason Eval/Treat Not Completed: Medical issues which prohibited therapy(pt remains on vent with bedrest orders) x 3 days. Will sign off and await new order when pt medically ready   Joseph Rubio 04/25/2017, 7:14 AM  Joseph Rubio, PT [phone]

## 2017-04-25 NOTE — Progress Notes (Signed)
Pt self extubated despite having his wrist restraint tied down and hand in a mitten.  Pt's sister was at the bedside and stated that he forcefully jerked his whole body up and was able to bend to grab the ET tube.  This happened very quickly. RN responded and immediately started bagging patient and suctioned out his mouth.  Dr. Merlene PullingHammonds was here on the unit and responded quickly and reintubated him.  He is now stable and being started on a propofol gtt.

## 2017-04-25 NOTE — Progress Notes (Signed)
Per RN pt self extubated.  RT arrived and assisted Dr. Merlene PullingHammonds with reintubation.  ETT 7.5, 24 @ the lip.  Pt tolerated well.  CXR Pending.

## 2017-04-25 NOTE — Progress Notes (Signed)
OT Cancellation Note  Patient Details Name: Joseph Rubio MRN: [id number] DOB: [birth year]   Cancelled Treatment:    Reason Eval/Treat Not Completed: Patient not medically ready(active bedrest orders)  Gaye AlkenBailey A Zhania Shaheen M.S., OTR/L Pager: [phone]   04/25/2017, 7:10 AM

## 2017-04-25 NOTE — Plan of Care (Signed)
Patient looks calm most of the time with current sedation settings. When he awakens he still get agitated very quickly and pulls at his IV lines and tubes.

## 2017-04-25 NOTE — Progress Notes (Signed)
Pt bradycardic.  MD aware.  Propofol and precedex gtts turned off.  Able to increase rate of fentanyl and given versed pushes as needed.

## 2017-04-25 NOTE — Procedures (Signed)
Endotracheal Intubation Procedure Note Indication for endotracheal intubation: respiratory failure Sedation: etomidate and fentanyl Paralytic: succinylcholine Equipment: Macintosh 3 laryngoscope blade and 7.285mm cuffed endotracheal tube Cricoid Pressure: yes Number of attempts: 1 ETT location confirmed by by auscultation, by CXR and ETCO2 monitor.  Patient self-extubated this AM despite having R-hand restrained and on sedation. RN thinks he tongued the ETT out. Following extubation he wasn't adequately protecting his airway. RUE and RLE with nonpurposeful movement. Emergently reintubated patient. Tolerated procedure well.  Vital signs stable throughout. CXR pending. Patient's sister at bedside and was asked to step into waiting room for procedure.

## 2017-04-25 NOTE — Progress Notes (Signed)
OT Cancellation Note  Patient Details Name: Leonides GrillsJeffery L Balderrama MRN: [id number] DOB: 04-23-1966   Cancelled Treatment:    Reason Eval/Treat Not Completed: Patient not medically ready. Pt not medically appropriate for therapies at this time. Will sign off and await reorder as pt is medically appropriate.  Gaye AlkenBailey A Sherrill Buikema M.S., OTR/L Pager: [phone]  04/25/2017, 9:07 AM

## 2017-04-25 NOTE — Progress Notes (Signed)
PULMONARY / CRITICAL CARE MEDICINE   Name: Joseph Rubio MRN: [id number] DOB: 10-27-1965    ADMISSION DATE:  01-02-2017 CONSULTATION DATE:  01-02-2017  REFERRING MD:  Dr. Otelia LimesLindzen   CHIEF COMPLAINT:  CVA  Brief Patient Summary:  51 year old male with PMH of Asthma on no meds, presented to ED on 11/12 after being found down in shower;  last spoke to family/ LKN 2300 on 11/11.  Vomited w/EMS.  Upon arrival to ED patient verbally responsive with left side weakness and right gaze deviation. While in CT scanner patient became altered. Requiring intubation. CT Head with large posterior basal ganglia and thalamus hemorrhage on the right with intraventricular extension and mass effect upon the right half of the midbrain.  UDS positive for cocaine and THC, ETOH 40.  NSGY consulted for possible EVD drain, deferred due no change in hemorrhage or change in ventricles given x3 head CTs.    SUBJECTIVE:  More awake and moving RUE and RLE since 11/14. Febrile on 11/14 and again this AM. This AM patient self-extubated despite sedation and wrist restraints. Was not protecting airway and required reintubation.   VITAL SIGNS: BP (!) 157/82 (BP Location: Left Arm)   Pulse 79   Temp (!) 101.6 F (38.7 C) (Axillary)   Resp (!) 23   Ht 5\' 8"  (1.727 m)   Wt 71.5 kg (157 lb 10.1 oz)   SpO2 100%   BMI 23.97 kg/m   VENTILATOR SETTINGS: Vent Mode: PRVC FiO2 (%):  [40 %-100 %] 100 % Set Rate:  [16 bmp] 16 bmp Vt Set:  [500 mL] 500 mL PEEP:  [5 cmH20] 5 cmH20 Plateau Pressure:  [14 cmH20-17 cmH20] 17 cmH20  INTAKE / OUTPUT: I/O last 3 completed shifts: In: 7032.9 [I.V.:4692.9; NG/GT:2340] Out: 5775 [Urine:5775]  PHYSICAL EXAMINATION: (after reintubation) General:  Well nourished adult male, intubated, sedated, critically ill HEENT: PERRL sluggish, OP clear, MM moist, Orally intubated Neuro: RASS -2; (was previously prior to reintubation moving RUE and RLE nonpurposefully; very strong in RUE;  continued hemiparesis on left.) CV: RRR no m/r/g  PULM: Coarse breath sounds b/l GI: soft, NTND  Extremities: warm/dry, trace BUE edema  Skin: no rashes  LABS:  BMET Recent Labs  Lab 04/23/17 0146  04/24/17 0340  04/24/17 1804 04/25/17 0257 04/25/17 0735  NA 143  144   < > 148*   < > 150* 153*  154* 154*  K 3.6  --  3.9  --   --  3.4*  --   CL 119*  --  122*  --   --  123*  --   CO2 19*  --  21*  --   --  24  --   BUN 9  --  6  --   --  7  --   CREATININE 1.11  --  1.00  --   --  0.96  --   GLUCOSE 124*  --  158*  --   --  163*  --    < > = values in this interval not displayed.   Electrolytes Recent Labs  Lab 04/23/17 0146 04/23/17 1425 04/24/17 0340 04/25/17 0257  CALCIUM 8.0*  --  8.5* 8.5*  MG 2.2 2.2 2.1  --   PHOS 3.5 2.6 3.1  --    CBC Recent Labs  Lab 04/23/17 0146 04/24/17 0340 04/25/17 0257  WBC 8.6 13.0* 13.9*  HGB 9.0* 8.7* 8.5*  HCT 28.7* 28.8* 28.6*  PLT 271 296  281   Coag's Recent Labs  Lab August 30, 2016 0040  APTT 28  INR 1.23   Sepsis Markers Recent Labs  Lab 04/24/17 1342 04/25/17 0257  LATICACIDVEN 0.9  --   PROCALCITON <0.10 <0.10   ABG Recent Labs  Lab 04/23/17 0345 04/24/17 0329 04/25/17 0407  PHART 7.327* 7.316* 7.354  PCO2ART 40.4 44.6 43.2  PO2ART 122* 83.2 157*   Liver Enzymes Recent Labs  Lab August 30, 2016 0040  AST 36  ALT 25  ALKPHOS 74  BILITOT 0.7  ALBUMIN 3.9   Cardiac Enzymes No results for input(s): TROPONINI, PROBNP in the last 168 hours.  Glucose Recent Labs  Lab 04/24/17 1237 04/24/17 1700 04/24/17 1941 04/24/17 2340 04/25/17 0336 04/25/17 0755  GLUCAP 123* 137* 159* 116* 134* 162*   Imaging Dg Chest Port 1 View  Result Date: 04/25/2017 CLINICAL DATA:  Shortness of breath. EXAM: PORTABLE CHEST 1 VIEW COMPARISON:  Radiograph April 24, 2017. FINDINGS: Stable cardiomediastinal silhouette. Endotracheal and nasogastric tubes are unchanged in position. Minimal left apical pneumothorax is  noted which is significantly improved compared to prior exam. Stable right basilar atelectasis or infiltrate is noted. No significant pleural effusion is noted. Bony thorax is unremarkable. IMPRESSION: Stable support apparatus. Minimal left apical pneumothorax is noted which is improved compared to prior exam. Stable right basilar atelectasis or infiltrate. Electronically Signed   By: Lupita RaiderJames  Green Jr, M.D.   On: 04/25/2017 09:40   STUDIES:  CT Head 11/14>>  1. Overall little interval change in size and appearance of acute intraparenchymal hematoma centered at the left thalamus with intraventricular extension and mild localized midline shift. No significant interval change in size and configuration of the Ventricles. 2. Negative CTA with no vascular abnormality underlying the intraparenchymal hematoma identified. 3. Moderate carotid siphon atherosclerosis without high-grade flow-limiting stenosis. No hemodynamically significant or correctable stenosis within the intracranial circulation. CT Head 11/12 > 1. Acute hemorrhage centered in right thalamus, 21 cc. 2. Extension of hemorrhage into the ventricular system with large volume and right lateral ventricle and small volume within left lateral ventricle and third ventricle. 3. Mass effect with 5 mm right-to-left midline shift and minimal downward compression of the midbrain. 4. Mild hydrocephalus of lateral and third ventricles. CT C Spine 11/12 >  No acute fracture or dislocation of the cervical spine. CXR 11/12 > Endotracheal tube with tip appearing above the carina. Enteric tube extends into the left hemiabdomen with tip beyond the inferior margin of the image. CT head 11/12 at 1848 >> Unchanged intraparenchymal hematoma centered in the left thalamus with associated intraventricular extension and 3 mm of leftward midline shift. No new hemorrhage. Unchanged size and configuration of the ventricles CXR 11/13 > stable support apparatus; mild to mod  left pnx- new; bibasilar changes CXR 11/13 noon >  Stable left pnx, mild basilar subsegmental atelectasis  CULTURES: Sputum culture (11/14): GPC's Blood cultures (11/14): no growth  ANTIBIOTICS: Zosyn 11/14>> Vanc 11/15>>  SIGNIFICANT EVENTS: 11/12 > Presents to ED, intubated, 3% 11/13 >  Left mild/mod pnx 11/15 > tiny left PTX; RLL pna  LINES/TUBES: ETT 11/12 >>11/15 ETT 11/15>> OGT 11/12  DISCUSSION: 51 year old male presents to ED with left sided weakness and right gaze deviation. CT Head with right thalamus hemorrhage with extension in the the ventricular system.  UDS + cocaine/TCH, ETOH 40  ASSESSMENT / PLAN:  Respiratory Insufficiency in setting of Acute CVA H/O Asthma - no home meds Left mild to mod pneumothorax 11/13- likely from inhaled illegal drug  abuse, spontaneous, +/- need for MV Tobacco Abuse Plan  - Continue full vent support - no SBT given pnx and ICH/ with cerebral edema - Continue FiO2 at 100% to promote reabsorption of pnx; Need to readdress with patient's children if he is still "do not escalate" because if not should place pigtail chest tube prior to SBT and any extubation attempt - CXR on my review today (no official Rads review yet) previous left PTX now appears tiny to resolved. RLL infiltrate appreciated.  - Patient's son (one of 5 adult children) had previously made him DNR and do not escalate care. However the son appeared to be uncertain yesterday what he wanted or what the patient would want. Need to readdress goals of care today, ideally with all the adult children present.  - repeat CXR and ABG in AM - VAP protocol - PPI for SUP  Aspiration pneumonia: - CXR shows bibasilar infiltrates (R>L) on my review. Patient now has copious creamy secretions from ETT and persistent fever.  - Sputum gram stain shows GPC's. MRSA nares was negative, so started Zosyn monotherapy on 11/14. Now given continued fever will add Vancomycin as well while await sputum  culture speciation.  - blood cultures (11/14): no growth thus far - obtain UA given persistent fever.   HTN Crisis; Cocaine abuse - continues have difficulty with BP control requiring nicardipine gtt - Cardiac Monitoring  - Cardene prn for BP goal < 220/120 - no beta blockers given cocaine use - continue cautiously w/ norvasc and HCTZ while on continuous sedation to prevent acute hypotension  Acute Hemorrhage -INR 15.4, APTT 28 Plan  -Trend CBC  -Hold all anticoagulation -SCDs only    Acute Encephalopathy secondary to Acute CVA  Right Thalamus Acute Hemorrhage with extension into the ventricular system with 5 mm right to left midline shift  ETOH, cocaine, and THC abuse - admission ETOH Level 40  Plan  -  Neurology Primary -  NGSY following, EVD not indicated at this point given stable bleed/ ventricles  -  Frequent Neuro Checks   -  MRI/MRA ordered  -  Continue 3% saline @ 70cc/hr; if requires >75cc/hr will need CVC  - RASS goal 0/-1  - not able to achieve adequate sedation despite fentanyl gtt @ and precedex @ 1.2; will add low dose propofol gtt; will increase max rate allowed on fentanyl to 438mcg/hr; continue versed IV PRN  - PAD protocol with bowel regimen - continue MVI, thiamine, and folate  FAMILY  - Updates: patient's sister at bedside today. Patient's son called on phone and told RN in my presence that we could share information regarding patient's condition with the sister but not with other family members. Plan is for the patient's adult children (5 total) to meet today for a family meeting to re-address goals of care.  - Inter-disciplinary family meet or Palliative Care meeting due by: 04/29/2017   DVT prophylaxis: SCDs SUP: PPI Diet: NPO/ TF  Activity: bedrest Disposition : ICU   60 minutes critical care time  Milana Obey, MD Pulmonary & Critical Care Medicine Pager: [phone]

## 2017-04-25 NOTE — Progress Notes (Signed)
Pharmacy Antibiotic Note  Joseph Rubio is a 51 y.o. male admitted on 04/12/2017 with pneumonia.    Plan: Zosyn 3.375g IV q8h (4h inf) Add vanc 750 mg q8h Monitor renal fx cx vt prn  Height: 5\' 8"  (172.7 cm) Weight: 157 lb 10.1 oz (71.5 kg) IBW/kg (Calculated) : 68.4  Temp (24hrs), Avg:100 F (37.8 C), Min:98.1 F (36.7 C), Max:101.6 F (38.7 C)  Recent Labs  Lab 05/10/2017 0040 04/12/2017 0055 04/23/17 0146 04/24/17 0340 04/24/17 1342 04/25/17 0257  WBC 9.0  --  8.6 13.0*  --  13.9*  CREATININE 0.99 1.00 1.11 1.00  --  0.96  LATICACIDVEN  --   --   --   --  0.9  --     Estimated Creatinine Clearance: 90.1 mL/min (by C-G formula based on SCr of 0.96 mg/dL).    Not on File  Isaac BlissMichael Markale Birdsell, PharmD, BCPS, BCCCP Clinical Pharmacist Clinical phone for 04/25/2017 from 7a-3:30p: [phone] If after 3:30p, please call main pharmacy at: [phone] 04/25/2017 10:45 AM

## 2017-04-26 ENCOUNTER — Inpatient Hospital Stay (HOSPITAL_COMMUNITY): Payer: Medicaid Other

## 2017-04-26 DIAGNOSIS — J690 Pneumonitis due to inhalation of food and vomit: Secondary | ICD-10-CM

## 2017-04-26 DIAGNOSIS — J9383 Other pneumothorax: Secondary | ICD-10-CM

## 2017-04-26 LAB — BLOOD GAS, ARTERIAL
ACID-BASE EXCESS: 0.5 mmol/L (ref 0.0–2.0)
BICARBONATE: 25.3 mmol/L (ref 20.0–28.0)
Drawn by: 10006
FIO2: 100
LHR: 16 {breaths}/min
O2 SAT: 99.8 %
PEEP: 5 cmH2O
PH ART: 7.363 (ref 7.350–7.450)
Patient temperature: 98.6
VT: 500 mL
pCO2 arterial: 45.6 mmHg (ref 32.0–48.0)
pO2, Arterial: 316 mmHg — ABNORMAL HIGH (ref 83.0–108.0)

## 2017-04-26 LAB — MAGNESIUM: Magnesium: 2.1 mg/dL (ref 1.7–2.4)

## 2017-04-26 LAB — BASIC METABOLIC PANEL
Anion gap: 7 (ref 5–15)
BUN: 10 mg/dL (ref 6–20)
CO2: 25 mmol/L (ref 22–32)
CREATININE: 0.91 mg/dL (ref 0.61–1.24)
Calcium: 8.7 mg/dL — ABNORMAL LOW (ref 8.9–10.3)
Chloride: 124 mmol/L — ABNORMAL HIGH (ref 101–111)
Glucose, Bld: 160 mg/dL — ABNORMAL HIGH (ref 65–99)
POTASSIUM: 3.2 mmol/L — AB (ref 3.5–5.1)
SODIUM: 156 mmol/L — AB (ref 135–145)

## 2017-04-26 LAB — CBC WITH DIFFERENTIAL/PLATELET
BASOS ABS: 0 10*3/uL (ref 0.0–0.1)
Basophils Relative: 0 %
EOS ABS: 0.2 10*3/uL (ref 0.0–0.7)
EOS PCT: 2 %
HCT: 26.8 % — ABNORMAL LOW (ref 39.0–52.0)
HEMOGLOBIN: 7.8 g/dL — AB (ref 13.0–17.0)
Lymphocytes Relative: 15 %
Lymphs Abs: 2 10*3/uL (ref 0.7–4.0)
MCH: 28.5 pg (ref 26.0–34.0)
MCHC: 29.1 g/dL — ABNORMAL LOW (ref 30.0–36.0)
MCV: 97.8 fL (ref 78.0–100.0)
Monocytes Absolute: 1.4 10*3/uL — ABNORMAL HIGH (ref 0.1–1.0)
Monocytes Relative: 11 %
NEUTROS PCT: 72 %
Neutro Abs: 10 10*3/uL — ABNORMAL HIGH (ref 1.7–7.7)
PLATELETS: 306 10*3/uL (ref 150–400)
RBC: 2.74 MIL/uL — AB (ref 4.22–5.81)
RDW: 16 % — ABNORMAL HIGH (ref 11.5–15.5)
WBC: 13.7 10*3/uL — AB (ref 4.0–10.5)

## 2017-04-26 LAB — CULTURE, RESPIRATORY

## 2017-04-26 LAB — CULTURE, RESPIRATORY W GRAM STAIN

## 2017-04-26 LAB — GLUCOSE, CAPILLARY
GLUCOSE-CAPILLARY: 156 mg/dL — AB (ref 65–99)
GLUCOSE-CAPILLARY: 133 mg/dL — AB (ref 65–99)
Glucose-Capillary: 115 mg/dL — ABNORMAL HIGH (ref 65–99)
Glucose-Capillary: 150 mg/dL — ABNORMAL HIGH (ref 65–99)
Glucose-Capillary: 152 mg/dL — ABNORMAL HIGH (ref 65–99)
Glucose-Capillary: 169 mg/dL — ABNORMAL HIGH (ref 65–99)

## 2017-04-26 LAB — SODIUM
SODIUM: 155 mmol/L — AB (ref 135–145)
SODIUM: 152 mmol/L — AB (ref 135–145)
SODIUM: 152 mmol/L — AB (ref 135–145)
Sodium: 156 mmol/L — ABNORMAL HIGH (ref 135–145)

## 2017-04-26 LAB — PROCALCITONIN

## 2017-04-26 LAB — TRIGLYCERIDES: Triglycerides: 125 mg/dL (ref <150)

## 2017-04-26 LAB — PHOSPHORUS: PHOSPHORUS: 3 mg/dL (ref 2.5–4.6)

## 2017-04-26 MED ORDER — IOPAMIDOL (ISOVUE-370) INJECTION 76%
INTRAVENOUS | Status: AC
  Administered 2017-04-26: 50 mL
  Filled 2017-04-26: qty 50

## 2017-04-26 MED ORDER — CLONIDINE HCL 0.1 MG PO TABS
0.1000 mg | ORAL_TABLET | Freq: Three times a day (TID) | ORAL | Status: DC
  Administered 2017-04-27 – 2017-04-29 (×4): 0.1 mg via ORAL
  Filled 2017-04-26 (×9): qty 1

## 2017-04-26 MED ORDER — BISACODYL 10 MG RE SUPP
10.0000 mg | Freq: Once | RECTAL | Status: AC
  Administered 2017-04-26: 10 mg via RECTAL
  Filled 2017-04-26: qty 1

## 2017-04-26 MED ORDER — CLONIDINE ORAL SUSPENSION 10 MCG/ML
0.1000 mg | Freq: Three times a day (TID) | ORAL | Status: DC
  Filled 2017-04-26: qty 10

## 2017-04-26 MED ORDER — POTASSIUM CHLORIDE 10 MEQ/100ML IV SOLN
10.0000 meq | INTRAVENOUS | Status: AC
  Administered 2017-04-26 (×6): 10 meq via INTRAVENOUS
  Filled 2017-04-26 (×6): qty 100

## 2017-04-26 NOTE — Progress Notes (Signed)
GUEST GUEST eLink Physician-Brief Progress Note Patient Name: Joseph GrillsJeffery L Rubio DOB: 28-Jun-1965 MRN: [id number]   Date of Service  04/26/2017  HPI/Events of Note  9 beat run vtach BP stable K 3.2  eICU Interventions  Replete K. Check Mg, phos     Intervention Category Minor Interventions: Electrolytes abnormality - evaluation and management  Davine Coba 04/26/2017, 4:08 AM

## 2017-04-26 NOTE — Progress Notes (Signed)
MD made aware of patient 4.26 second pause at 1950. EKG strip saved. RN will continue to monitor.

## 2017-04-26 NOTE — Clinical Social Work Note (Signed)
Clinical Social Worker met with patient sister, [name], at bedside - full assessment to follow.  Carolee Rota did provide phone number for patient son, [name] who states that he does not want any part of decision making and feels that patient son, [name], would be best for all decision making.  Patient sister states that patient has 5 children, 4 of which are adults.  Patient son [name] at bedside, patient son [name] does not want to be involved, patient son [name] has no available contact information (lives in [place]), and patient oldest daughter, [name], has been adopted and her mother lives in [place], [place].  Patient was never married to any of the mothers of any of the children per family report.  CSW will continue to make efforts for family, however at this time patient son, [name] and patient sister Carolee Rota are available at bedside and are currently acting in the best interest of the patient.  CSW to complete full assessment and remains available for support and to assist with discharge planning needs.  Barbette Or, [place]

## 2017-04-26 NOTE — Progress Notes (Signed)
Pt's son Joseph BrideDelaney Rubio came to see his father and stated he did not want to make any decisions regarding his father's care but would let those decisions be made by his brother Joseph Rubio.  Joseph's phone number is [phone].  Joseph PurpuraSusan Nayellie Sanseverino RN

## 2017-04-26 NOTE — Progress Notes (Signed)
   04/26/17 1200  Clinical Encounter Type  Visited With Patient and family together  Visit Type Follow-up  Referral From Nurse  Consult/Referral To Chaplain  Spiritual Encounters  Spiritual Needs Emotional  Stress Factors  Family Stress Factors Health changes  Chaplain visited with family, Sister of PT about the PT's condition.  The family is of great faith.  The family still needs to provide family contact info so that a family meeting can be held.

## 2017-04-26 NOTE — Progress Notes (Signed)
Discussion with patient's son Belva CromeJaquan and his sister. Given the clinical improvements he has been making, they want to make him FULL CODE now. Will Change code status order to FULL CODE. IF for some reason his clinical status were to deteriorate, they are open to revisiting this code status discussion.

## 2017-04-26 NOTE — Progress Notes (Signed)
PULMONARY / CRITICAL CARE MEDICINE   Name: Joseph Rubio MRN: [id number] DOB: [birth year]    ADMISSION DATE:  04/23/2017 CONSULTATION DATE:  04/16/2017  REFERRING MD:  Dr. Otelia LimesLindzen   CHIEF COMPLAINT:  CVA  Brief Patient Summary:  51 year old male with PMH of Asthma on no meds, presented to ED on 11/12 after being found down in shower;  last spoke to family/ LKN 2300 on 11/11.  Vomited w/EMS.  Upon arrival to ED patient verbally responsive with left side weakness and right gaze deviation. While in CT scanner patient became altered. Requiring intubation. CT Head with large posterior basal ganglia and thalamus hemorrhage on the right with intraventricular extension and mass effect upon the right half of the midbrain.  UDS positive for cocaine and THC, ETOH 40.  NSGY consulted for possible EVD drain, deferred due no change in hemorrhage or change in ventricles given x3 head CTs.    SUBJECTIVE:  More awake and moving RUE and RLE since 11/14. Febrile on 11/14 and again 11/15, now afebrile. Patient self-extubated 11/15 despite sedation and wrist restraints. Was not protecting airway and required reintubation. Overnight continues to have difficulty with adequate sedation.   VITAL SIGNS: BP 131/79   Pulse (!) 54   Temp 99.8 F (37.7 C) (Axillary)   Resp 14   Ht 5\' 8"  (1.727 m)   Wt 71.3 kg (157 lb 3 oz)   SpO2 100%   BMI 23.90 kg/m   VENTILATOR SETTINGS: Vent Mode: PRVC FiO2 (%):  [100 %] 100 % Set Rate:  [16 bmp] 16 bmp Vt Set:  [500 mL] 500 mL PEEP:  [5 cmH20] 5 cmH20 Plateau Pressure:  [12 cmH20-16 cmH20] 16 cmH20  INTAKE / OUTPUT: I/O last 3 completed shifts: In: 7503.5 [I.V.:4489.1; NG/GT:2076.9; IV Piggyback:937.5] Out: 4625 [Urine:4625]  PHYSICAL EXAMINATION: (after reintubation) General:  Well nourished adult male, intubated, inadequately sedated, critically ill HEENT: PERRL sluggish, OP clear, MM moist, Orally intubated Neuro: RASS +3, sitting up in bed and pulling at  restraints. Moving RUE and RLE strongly. LUE and LLE still flaccid.  CV: RRR no m/r/g  PULM: CTA b/l  GI: soft, NTND  Extremities: warm/dry, trace BUE edema  Skin: no rashes  LABS:  BMET Recent Labs  Lab 04/24/17 0340  04/25/17 0257  04/25/17 1904 04/26/17 0232 04/26/17 0715  NA 148*   < > 153*  154*   < > 155* 155*  156* 156*  K 3.9  --  3.4*  --   --  3.2*  --   CL 122*  --  123*  --   --  124*  --   CO2 21*  --  24  --   --  25  --   BUN 6  --  7  --   --  10  --   CREATININE 1.00  --  0.96  --   --  0.91  --   GLUCOSE 158*  --  163*  --   --  160*  --    < > = values in this interval not displayed.   Electrolytes Recent Labs  Lab 04/23/17 1425 04/24/17 0340 04/25/17 0257 04/26/17 0232 04/26/17 0715  CALCIUM  --  8.5* 8.5* 8.7*  --   MG 2.2 2.1  --   --  2.1  PHOS 2.6 3.1  --   --  3.0   CBC Recent Labs  Lab 04/24/17 0340 04/25/17 0257 04/26/17 0232  WBC 13.0* 13.9*  13.7*  HGB 8.7* 8.5* 7.8*  HCT 28.8* 28.6* 26.8*  PLT 296 281 306   Coag's Recent Labs  Lab 04/24/2017 0040  APTT 28  INR 1.23   Sepsis Markers Recent Labs  Lab 04/24/17 1342 04/25/17 0257 04/26/17 0232  LATICACIDVEN 0.9  --   --   PROCALCITON <0.10 <0.10 <0.10   ABG Recent Labs  Lab 04/24/17 0329 04/25/17 0407 04/26/17 0420  PHART 7.316* 7.354 7.363  PCO2ART 44.6 43.2 45.6  PO2ART 83.2 157* 316*   Liver Enzymes Recent Labs  Lab 04/30/2017 0040  AST 36  ALT 25  ALKPHOS 74  BILITOT 0.7  ALBUMIN 3.9   Cardiac Enzymes No results for input(s): TROPONINI, PROBNP in the last 168 hours.  Glucose Recent Labs  Lab 04/25/17 1312 04/25/17 1621 04/25/17 2011 04/25/17 2322 04/26/17 0354 04/26/17 0730  GLUCAP 115* 103* 139* 157* 156* 115*   Imaging Dg Chest Port 1 View  Result Date: 04/26/2017 CLINICAL DATA:  Shortness of breath. EXAM: PORTABLE CHEST 1 VIEW COMPARISON:  04/25/2017. FINDINGS: Endotracheal tube and NG tube in stable position. Cardiomegaly with mild  pulmonary vascular prominence and mild bibasilar infiltrates suggesting pulmonary edema. Bibasilar pneumonia cannot be excluded. No change from prior exam . No pleural effusion. Interval resolution of left apical pneumothorax. IMPRESSION: 1. Lines and tubes in stable position. Interval resolution of left apical pneumothorax. 2. Cardiomegaly with mild bibasilar pulmonary infiltrates suggesting pulmonary edema. Bibasilar pneumonia cannot be excluded. No change from prior exam . Electronically Signed   By: Maisie Fus  Register   On: 04/26/2017 09:42   STUDIES:  CT Head 11/14>>  1. Overall little interval change in size and appearance of acute intraparenchymal hematoma centered at the left thalamus with intraventricular extension and mild localized midline shift. No significant interval change in size and configuration of the Ventricles. 2. Negative CTA with no vascular abnormality underlying the intraparenchymal hematoma identified. 3. Moderate carotid siphon atherosclerosis without high-grade flow-limiting stenosis. No hemodynamically significant or correctable stenosis within the intracranial circulation. CT Head 11/12 > 1. Acute hemorrhage centered in right thalamus, 21 cc. 2. Extension of hemorrhage into the ventricular system with large volume and right lateral ventricle and small volume within left lateral ventricle and third ventricle. 3. Mass effect with 5 mm right-to-left midline shift and minimal downward compression of the midbrain. 4. Mild hydrocephalus of lateral and third ventricles. CT C Spine 11/12 >  No acute fracture or dislocation of the cervical spine. CXR 11/12 > Endotracheal tube with tip appearing above the carina. Enteric tube extends into the left hemiabdomen with tip beyond the inferior margin of the image. CT head 11/12 at 1848 >> Unchanged intraparenchymal hematoma centered in the left thalamus with associated intraventricular extension and 3 mm of leftward midline shift. No  new hemorrhage. Unchanged size and configuration of the ventricles CXR 11/13 > stable support apparatus; mild to mod left pnx- new; bibasilar changes CXR 11/13 noon >  Stable left pnx, mild basilar subsegmental atelectasis  CULTURES: Sputum culture (11/14): Strep pneumo Blood cultures (11/14): no growth  ANTIBIOTICS: Zosyn 11/14>> Vanc 11/15>>11/15  SIGNIFICANT EVENTS: 11/12 > Presents to ED, intubated, 3% 11/13 >  Left mild/mod pnx 11/15 > tiny left PTX; RLL pna 11/16> resolution of previously seen PTX; still with mild basilar infiltrates  LINES/TUBES: ETT 11/12 >>11/15 ETT 11/15>> OGT 11/12  DISCUSSION: 51 year old male presents to ED with left sided weakness and right gaze deviation. CT Head with right thalamus hemorrhage with extension in the  the ventricular system.  UDS + cocaine/TCH, ETOH 40  ASSESSMENT / PLAN:  Respiratory Insufficiency in setting of Acute CVA H/O Asthma - no home meds Left mild to mod pneumothorax 11/13- likely from inhaled illegal drug abuse, spontaneous, +/- need for MV Tobacco Abuse Plan  - Continue full vent support - CXR today on my review shows resolution of previously seen tiny left-sided pneumothorax; therefore will start weaning FIO2. Will hopefully be able to start SBT's soon once FIO2 50% or lower and if pneumothorax doesn't re-expand on tomorrow's CXR.  - No change in small basilar infiltrates on today's CXR. Pneumonia addressed below. - repeat CXR and ABG in AM - VAP protocol - PPI for SUP  Aspiration pneumonia: - CXR shows bibasilar infiltrates (R>L) on my review, unchanged from prior CXR. Still has creamy ETT secretions but less than before.  - Sputum culture growing Strep pneumo; s/p Vanc, Continue Zosyn (day #3/7). Follow-up speciation.  - Now is afebrile; last fever 11/15 @ 7pm.  - blood cultures (11/14): no growth - UA negative.   HTN Crisis; Cocaine abuse - continues have difficulty with BP control requiring nicardipine  gtt - Cardiac Monitoring  - Cardene prn for BP goal < 220/120 - no beta blockers given cocaine use - continue cautiously w/ norvasc and HCTZ while on continuous sedation to prevent acute hypotension - start clonidine 0.1mg  PO q8 for treatment of cocaine withdrawal.   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   Acute Hemorrhage -INR 15.4, APTT 28 Plan  -Trend CBC  -Hold all anticoagulation -SCDs only    Acute Encephalopathy secondary to Acute CVA  Right Thalamus Acute Hemorrhage with extension into the ventricular system with 5 mm right to left midline shift  ETOH, cocaine, and THC abuse - admission ETOH Level 40  Plan  -  Neurology Primary -  NGSY following, EVD not indicated at this point given stable bleed/ ventricles  -  Frequent Neuro Checks   -  MRI/MRA ordered  - Na 156 this AM; will stop 3% saline per Neuro rec's. No plan to restart it since clinically patient is improving.  - RASS goal -1/-2  - undersedated overnight and this AM; increased fentanyl gtt from 275mcg to 350mcg; continue Precedex. Unable to tolerate adding propofol due to bradycardia. Continue Versed IV PRN. Have discussed sedation regimen many times with family; have discussed how we do not want to start versed gtt if at all possible because increases risk of ICU delirium and has been shown to increase vent days and ICU days.  - continue MVI, thiamine, and folate  Constipation:  - continue docusate and senna BID - give bisacodyl suppository now  FAMILY  - Updates: patient's sister at bedside today.  - Complicated family dynamics. Initially we were told that there are 6 children, of whom 5 are adults. Now we are told there are only 5 children, 4 of whom are adults. Of those 4, one daughter has been "missing" for 20 years. But later the same family says that daughter was adopted by someone else. If adopted then why is she missing? Not sure we are  getting the full story from the family who is at bedside. Of the remaining 3 children, one "EJ" reportedly came up to the hospital on 11/14 PM and told the bedside RN that he wants to waive his right to helping make patient care decisions. The RN was called on 11/15  PM (while off duty) and confirmed via phone that that is what was said. This leaves 2 remaining children: [place] and someone else. However whenever we ask Belva Crome about the other sibling he talks around the issue and doesn't answer or give information on that person. Patient's sister is consistently at bedside, and [place] visits daily. Have asked social work to please assist [place] in finding any available children so that they have an opportunity to aid in decisions regarding goals of care.    DVT prophylaxis: SCDs SUP: PPI Diet: NPO/ TF  Activity: bedrest Disposition : ICU   60 minutes critical care time  Milana Obey, MD Pulmonary & Critical Care Medicine Pager: [phone]

## 2017-04-26 NOTE — Progress Notes (Signed)
STROKE TEAM PROGRESS NOTE   HISTORY OF PRESENT ILLNESS (per record)  Joseph Rubio is an 51 y.o. male who was LKN at 2300 on Sunday night per EMS. He was left at home to babysit a child. When family returned, he was found down on the floor of the bathroom naked near the shower. Family suspected he had collapsed after getting out of the shower. He was found at 12:07 AM. He vomited at home prior to EMS arrival. En route they noted forced gaze deviation to the right with left sided weakness. On arrival to the ED he was able to speak to staff. During CT patient became altered. CT head showed a large posterior basal ganglia and thalamus hemorrhage on the right with intraventricular extension and mass effect upon the right half of the midbrain. He was able to state his PMHx to EMS en route as consisting of asthma only. He denied being on any medications Patient was not administered IV t-PA secondary to intracerebral hemorrhage He was admitted to the neuro ICU for further evaluation and treatment.   SUBJECTIVE (INTERVAL HISTORY) His  sister   is at the bedside. Marland Kitchen. He remains agitated requiring sedation and has purposeful movements on the right but plegic on the left. Blood pressure is controlled on Cardene drip  Hypertonic saline drip has been on hold as   sodium is 156  .   OBJECTIVE Temp:  [99.2 F (37.3 C)-100.5 F (38.1 C)] 99.6 F (37.6 C) (11/16 1200) Pulse Rate:  [43-111] 60 (11/16 1200) Cardiac Rhythm: Sinus bradycardia (11/16 1200) Resp:  [11-29] 19 (11/16 1200) BP: (112-203)/(67-106) 159/85 (11/16 1200) SpO2:  [97 %-100 %] 100 % (11/16 1200) FiO2 (%):  [60 %-100 %] 60 % (11/16 1200) Weight:  [157 lb 3 oz (71.3 kg)] 157 lb 3 oz (71.3 kg) (11/16 0431)  CBC:  Recent Labs  Lab 04/25/17 0257 04/26/17 0232  WBC 13.9* 13.7*  NEUTROABS 9.9* 10.0*  HGB 8.5* 7.8*  HCT 28.6* 26.8*  MCV 97.6 97.8  PLT 281 306    Basic Metabolic Panel:  Recent Labs  Lab 04/24/17 0340  04/25/17 0257   04/26/17 0232 04/26/17 0715  NA 148*   < > 153*  154*   < > 155*  156* 156*  K 3.9  --  3.4*  --  3.2*  --   CL 122*  --  123*  --  124*  --   CO2 21*  --  24  --  25  --   GLUCOSE 158*  --  163*  --  160*  --   BUN 6  --  7  --  10  --   CREATININE 1.00  --  0.96  --  0.91  --   CALCIUM 8.5*  --  8.5*  --  8.7*  --   MG 2.1  --   --   --   --  2.1  PHOS 3.1  --   --   --   --  3.0   < > = values in this interval not displayed.    Lipid Panel:     Component Value Date/Time   TRIG 125 04/26/2017 0232   HgbA1c:  Lab Results  Component Value Date   HGBA1C 4.9 05-25-2017   Urine Drug Screen:     Component Value Date/Time   LABOPIA NONE DETECTED 05-25-2017 0615   COCAINSCRNUR POSITIVE (A) 05-25-2017 0615   LABBENZ NONE DETECTED 05-25-2017 0615   AMPHETMU  NONE DETECTED 05/04/2017 0615   THCU POSITIVE (A) 04/27/2017 0615   LABBARB NONE DETECTED 04/30/2017 0615    Alcohol Level     Component Value Date/Time   ETH 40 (H) 04/28/2017 0133    IMAGING  Dg Chest Port 1 View  Result Date: 04/26/2017 CLINICAL DATA:  Shortness of breath. EXAM: PORTABLE CHEST 1 VIEW COMPARISON:  04/25/2017. FINDINGS: Endotracheal tube and NG tube in stable position. Cardiomegaly with mild pulmonary vascular prominence and mild bibasilar infiltrates suggesting pulmonary edema. Bibasilar pneumonia cannot be excluded. No change from prior exam . No pleural effusion. Interval resolution of left apical pneumothorax. IMPRESSION: 1. Lines and tubes in stable position. Interval resolution of left apical pneumothorax. 2. Cardiomegaly with mild bibasilar pulmonary infiltrates suggesting pulmonary edema. Bibasilar pneumonia cannot be excluded. No change from prior exam . Electronically Signed   By: Maisie Fus  Register   On: 04/26/2017 09:42   Dg Chest Port 1 View  Result Date: 04/25/2017 CLINICAL DATA:  Acute respiratory failure. EXAM: PORTABLE CHEST 1 VIEW COMPARISON:  Chest x-ray from same date at 09:11.  FINDINGS: Endotracheal tube in good position with the tip approximately 3.5 cm above the level of the carina. Enteric tube is unchanged in position. The cardiomediastinal silhouette is normal in size. Trace left apical pneumothorax is unchanged. Unchanged right basilar atelectasis. Increased density in the left lower lobe. No pleural effusion. No acute osseous abnormality. IMPRESSION: 1. Increased consolidation in the left lower lobe, which could reflect pneumonia or atelectasis. Unchanged right basilar atelectasis. 2. Stable trace left apical pneumothorax. Electronically Signed   By: Obie Dredge M.D.   On: 04/25/2017 10:12   Dg Chest Port 1 View  Result Date: 04/25/2017 CLINICAL DATA:  Shortness of breath. EXAM: PORTABLE CHEST 1 VIEW COMPARISON:  Radiograph April 24, 2017. FINDINGS: Stable cardiomediastinal silhouette. Endotracheal and nasogastric tubes are unchanged in position. Minimal left apical pneumothorax is noted which is significantly improved compared to prior exam. Stable right basilar atelectasis or infiltrate is noted. No significant pleural effusion is noted. Bony thorax is unremarkable. IMPRESSION: Stable support apparatus. Minimal left apical pneumothorax is noted which is improved compared to prior exam. Stable right basilar atelectasis or infiltrate. Electronically Signed   By: Lupita Raider, M.D.   On: 04/25/2017 09:40    CT angio brain 04/24/2017 : 1. Overall little interval change in size and appearance of acute intraparenchymal hematoma centered at the left thalamus with intraventricular extension and mild localized midline shift. No significant interval change in size and configuration of the ventricles. 2. Negative CTA with no vascular abnormality underlying the intraparenchymal hematoma identified. 3. Moderate carotid siphon atherosclerosis without high-grade flow-limiting stenosis. No hemodynamically significant or correctable stenosis within the intracranial  circulation   PHYSICAL EXAM Middle-aged African-American male who is sedated and intubated. . Afebrile. Head is nontraumatic. Neck is supple without bruit.    Cardiac exam no murmur or gallop. Lungs are clear to auscultation. Distal pulses are well felt. Neurological Exam : Patient is sedated and intubated. Eyes are closed. He opens eyes to commands. Follows few commands on the right side. Pupils are small 2 mm sluggishly reactive. There is skew eye deviation with right eye downward and outward. Corneal reflexes are preserved. Dolls eye moments are sluggish. Cough and gag reflexes are present. He does have respiratory effort above ventilator settings he moves right upper and lower extremity semi-purposefully to sternal rub. No movement in the left upper extremity and trace movement in the left lower extremity to  painful stimuli. Tone is normal on the right and decreased on the left. Left plantar upgoing right downgoing. ASSESSMENT/PLAN Mr. Joseph Rubio is a 51 y.o. 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 Again noted is intraventricular extension with mild hydrocephalus. 3 mm RIGHT to LEFT midline shift is similar. Narrowed though patent basal cisterns. 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 No acute large vascular territory infarcts  MRI head not ordered  MRA head not ordered  Carotid Doppler  not ordered  2D Echo  pending  LDL unable to calculate  HgbA1c pending   SCDs for VTE prophylaxis Diet NPO time specified  No antithrombotic prior to admission, now on  No antithrombotic due to ICH  Therapy recommendations:  On hold Disposition:  pending Hypertension  Unstable  Permissive hypertension (OK if < 220/120) but gradually normalize in 5-7 days  Long-term BP goal normotensive  Hyperlipidemia  Home meds:  none  LDL cannot calculate goal < 70  Diabetes  HgbA1c pending goal < 7.0   Other Active Problems  Respiratory failure  Cocaine and marijuana abuse  Hospital day # 4  I have personally examined this patient, reviewed notes, independently viewed imaging studies, participated in medical decision making and plan of care.ROS completed by me personally and pertinent positives fully documented  I have made any additions or clarifications directly to the above note.   Plan : . Continue strict blood pressure control with the systolic blood pressure goal below   below 160. Hypertonic saline for cytotoxic edema with sodium goal 150-155.  Continue intubation.  I had a long discussion with the patient's sister about his prognosis and plan of care. She is realistic but wants us to continue supportive care for the next few days but by early next week she and his son will make decision on tracheostomy and PEG tube versus withdrawal of care.. . Discuss with Dr. Merlene PullingHammonds from critical care medicine This patient is critically ill and at significant risk of neurological worsening, death and care requires constant monitoring of vital signs, hemodynamics,respiratory and cardiac monitoring, extensive review of multiple databases, frequent neurological assessment, discussion with family, other specialists and medical decision making of high complexity.I have made any additions or clarifications directly to the above note.This critical care time does not reflect procedure time, or teaching time or supervisory time of PA/NP/Med Resident etc but could involve care discussion time.  I spent 35 minutes of neurocritical care time  in the care of  this patient.      Delia HeadyPramod Staci Carver, MD Medical Director [facility] Pager: [phone] 04/26/2017 1:34 PM   To contact Stroke Continuity provider, please refer to [email or web address]. After hours, contact General Neurology

## 2017-04-26 NOTE — Progress Notes (Signed)
Pt had a 7 beat run of Vtach.  Notified Dr Merlene PullingHammonds of the incident.  Heloise PurpuraSusan Brennley Curtice RN

## 2017-04-27 ENCOUNTER — Inpatient Hospital Stay (HOSPITAL_COMMUNITY): Payer: Medicaid Other

## 2017-04-27 ENCOUNTER — Encounter (HOSPITAL_COMMUNITY): Payer: Self-pay | Admitting: Certified Registered Nurse Anesthetist

## 2017-04-27 DIAGNOSIS — G919 Hydrocephalus, unspecified: Secondary | ICD-10-CM

## 2017-04-27 DIAGNOSIS — J9383 Other pneumothorax: Secondary | ICD-10-CM

## 2017-04-27 DIAGNOSIS — J9600 Acute respiratory failure, unspecified whether with hypoxia or hypercapnia: Secondary | ICD-10-CM

## 2017-04-27 DIAGNOSIS — I618 Other nontraumatic intracerebral hemorrhage: Secondary | ICD-10-CM

## 2017-04-27 LAB — GLUCOSE, CAPILLARY
GLUCOSE-CAPILLARY: 165 mg/dL — AB (ref 65–99)
GLUCOSE-CAPILLARY: 125 mg/dL — AB (ref 65–99)
GLUCOSE-CAPILLARY: 118 mg/dL — AB (ref 65–99)
GLUCOSE-CAPILLARY: 131 mg/dL — AB (ref 65–99)
Glucose-Capillary: 154 mg/dL — ABNORMAL HIGH (ref 65–99)
Glucose-Capillary: 126 mg/dL — ABNORMAL HIGH (ref 65–99)

## 2017-04-27 LAB — CBC WITH DIFFERENTIAL/PLATELET
Basophils Absolute: 0 10*3/uL (ref 0.0–0.1)
Basophils Relative: 0 %
EOS ABS: 0.2 10*3/uL (ref 0.0–0.7)
EOS PCT: 2 %
HCT: 25.5 % — ABNORMAL LOW (ref 39.0–52.0)
Hemoglobin: 7.8 g/dL — ABNORMAL LOW (ref 13.0–17.0)
LYMPHS ABS: 1.2 10*3/uL (ref 0.7–4.0)
LYMPHS PCT: 10 %
MCH: 29.2 pg (ref 26.0–34.0)
MCHC: 30.6 g/dL (ref 30.0–36.0)
MCV: 95.5 fL (ref 78.0–100.0)
MONOS PCT: 13 %
Monocytes Absolute: 1.5 10*3/uL — ABNORMAL HIGH (ref 0.1–1.0)
Neutro Abs: 9 10*3/uL — ABNORMAL HIGH (ref 1.7–7.7)
Neutrophils Relative %: 75 %
PLATELETS: 331 10*3/uL (ref 150–400)
RBC: 2.67 MIL/uL — AB (ref 4.22–5.81)
RDW: 15.8 % — ABNORMAL HIGH (ref 11.5–15.5)
WBC: 12 10*3/uL — AB (ref 4.0–10.5)

## 2017-04-27 LAB — BASIC METABOLIC PANEL
Anion gap: 11 (ref 5–15)
BUN: 12 mg/dL (ref 6–20)
CHLORIDE: 114 mmol/L — AB (ref 101–111)
CO2: 26 mmol/L (ref 22–32)
CREATININE: 0.81 mg/dL (ref 0.61–1.24)
Calcium: 8.6 mg/dL — ABNORMAL LOW (ref 8.9–10.3)
GFR calc Af Amer: >60 mL/min (ref >60)
GFR calc non Af Amer: >60 mL/min (ref >60)
GLUCOSE: 143 mg/dL — AB (ref 65–99)
POTASSIUM: 2.9 mmol/L — AB (ref 3.5–5.1)
Sodium: 151 mmol/L — ABNORMAL HIGH (ref 135–145)

## 2017-04-27 LAB — SODIUM
SODIUM: 149 mmol/L — AB (ref 135–145)
Sodium: 148 mmol/L — ABNORMAL HIGH (ref 135–145)
Sodium: 149 mmol/L — ABNORMAL HIGH (ref 135–145)

## 2017-04-27 LAB — BLOOD GAS, ARTERIAL
Acid-Base Excess: 3.2 mmol/L — ABNORMAL HIGH (ref 0.0–2.0)
Bicarbonate: 27.4 mmol/L (ref 20.0–28.0)
DRAWN BY: 39898
FIO2: 40
MECHVT: 500 mL
O2 SAT: 98.5 %
PATIENT TEMPERATURE: 98.6
PCO2 ART: 43.1 mmHg (ref 32.0–48.0)
PEEP: 5 cmH2O
PH ART: 7.42 (ref 7.350–7.450)
RATE: 16 resp/min
pO2, Arterial: 115 mmHg — ABNORMAL HIGH (ref 83.0–108.0)

## 2017-04-27 LAB — LIPID PANEL
Cholesterol: 125 mg/dL (ref 0–200)
HDL: 43 mg/dL (ref >40)
LDL CALC: 58 mg/dL (ref 0–99)
Total CHOL/HDL Ratio: 2.9 RATIO
Triglycerides: 122 mg/dL (ref <150)
VLDL: 24 mg/dL (ref 0–40)

## 2017-04-27 LAB — PHOSPHORUS: PHOSPHORUS: 3.8 mg/dL (ref 2.5–4.6)

## 2017-04-27 LAB — MAGNESIUM: MAGNESIUM: 2 mg/dL (ref 1.7–2.4)

## 2017-04-27 MED ORDER — LORAZEPAM 1 MG PO TABS
1.0000 mg | ORAL_TABLET | Freq: Two times a day (BID) | ORAL | Status: DC
  Administered 2017-04-27 – 2017-04-29 (×5): 1 mg
  Filled 2017-04-27 (×5): qty 1

## 2017-04-27 MED ORDER — SODIUM CHLORIDE 0.9 % IV SOLN
0.0000 mg/h | INTRAVENOUS | Status: DC
  Administered 2017-04-27: 1 mg/h via INTRAVENOUS
  Administered 2017-04-28 (×2): 2 mg/h via INTRAVENOUS
  Filled 2017-04-27 (×3): qty 10

## 2017-04-27 MED ORDER — HEPARIN SODIUM (PORCINE) 5000 UNIT/ML IJ SOLN
5000.0000 [IU] | Freq: Three times a day (TID) | INTRAMUSCULAR | Status: DC
  Administered 2017-04-27 – 2017-04-30 (×8): 5000 [IU] via SUBCUTANEOUS
  Filled 2017-04-27 (×8): qty 1

## 2017-04-27 MED ORDER — DEXTROSE 5 % IV SOLN
1.0000 g | INTRAVENOUS | Status: AC
  Administered 2017-04-27 – 2017-04-30 (×4): 1 g via INTRAVENOUS
  Filled 2017-04-27 (×4): qty 10

## 2017-04-27 MED ORDER — POTASSIUM CHLORIDE 10 MEQ/100ML IV SOLN
10.0000 meq | INTRAVENOUS | Status: AC
  Administered 2017-04-27 – 2017-04-28 (×6): 10 meq via INTRAVENOUS
  Filled 2017-04-27 (×6): qty 100

## 2017-04-27 MED ORDER — HYDRALAZINE HCL 20 MG/ML IJ SOLN
10.0000 mg | INTRAMUSCULAR | Status: DC | PRN
  Administered 2017-04-29: 20 mg via INTRAVENOUS
  Administered 2017-04-29: 10 mg via INTRAVENOUS
  Administered 2017-04-29: 40 mg via INTRAVENOUS
  Filled 2017-04-27: qty 2
  Filled 2017-04-27 (×3): qty 1

## 2017-04-27 NOTE — Progress Notes (Signed)
As much as it is possible the family would like to be present and made aware when the pt is put on pressure support from the ventilator. Pt has tried multiple times to self-extubate and family wants to be in the room and notified when pt's weaning from the ventilator.

## 2017-04-27 NOTE — Progress Notes (Signed)
PULMONARY / CRITICAL CARE MEDICINE   Name: Joseph Rubio MRN: [id number] DOB: 07/06/1965    ADMISSION DATE:  05/20/2017 CONSULTATION DATE:  May 20, 2017  REFERRING MD:  Dr. Otelia Limes   CHIEF COMPLAINT:  CVA  Brief Patient Summary:  51 year old male with PMH of Asthma on no meds, presented to ED on 11/12 with large posterior basal ganglia and thalamus hemorrhage on the right with intraventricular extension and mass effect upon the right half of the midbrain.  UDS positive for cocaine and THC, ETOH 40.  NSGY consulted for possible EVD drain, deferred due no change in hemorrhage or change in ventricles given x3 head CTs.     STUDIES:  CT Head 11/14>>  1. Overall little interval change in size and appearance of acute intraparenchymal hematoma centered at the left thalamus with intraventricular extension and mild localized midline shift. No significant interval change in size and configuration of the Ventricles. 2. Negative CTA with no vascular abnormality underlying the intraparenchymal hematoma identified. 3. Moderate carotid siphon atherosclerosis without high-grade flow-limiting stenosis. No hemodynamically significant or correctable stenosis within the intracranial circulation. CT Head 11/12 > 1. Acute hemorrhage centered in right thalamus, 21 cc. 2. Extension of hemorrhage into the ventricular system with large volume and right lateral ventricle and small volume within left lateral ventricle and third ventricle. 3. Mass effect with 5 mm right-to-left midline shift and minimal downward compression of the midbrain. 4. Mild hydrocephalus of lateral and third ventricles. CT C Spine 11/12 >  No acute fracture or dislocation of the cervical spine.  CT head 11/12 at 1848 >> Unchanged intraparenchymal hematoma centered in the left thalamus with associated intraventricular extension and 3 mm of leftward midline shift. No new hemorrhage. Unchanged size and configuration of the ventricles  CT  angio head 11/17 no change in hematoma   CULTURES: Sputum culture (11/14): Strep pneumo Blood cultures (11/14): no growth  ANTIBIOTICS: Zosyn 11/14>>11/17 Vanc 11/15>>11/15  SIGNIFICANT EVENTS: 11/12 > Presents to ED, intubated, 3% 11/13 >  Left mild/mod pnx 11/15 > tiny left PTX; RLL pna 11/16> resolution of previously seen PTX; still with mild basilar infiltrates  LINES/TUBES: ETT 11/12 >>11/15 ETT 11/15>> OGT 11/12    SUBJECTIVE:  Febrile  Agitated this am , appears strong & pulling at restraints on right Bradycardic except when agitated  VITAL SIGNS: BP (!) 149/82   Pulse 68   Temp (!) 100.7 F (38.2 C) (Axillary)   Resp (!) 21   Ht 5\' 8"  (1.727 m)   Wt 164 lb 7.4 oz (74.6 kg)   SpO2 100%   BMI 25.01 kg/m   VENTILATOR SETTINGS: Vent Mode: PRVC FiO2 (%):  [40 %-100 %] 40 % Set Rate:  [16 bmp] 16 bmp Vt Set:  [500 mL] 500 mL PEEP:  [5 cmH20] 5 cmH20 Plateau Pressure:  [16 cmH20-19 cmH20] 16 cmH20  INTAKE / OUTPUT: I/O last 3 completed shifts: In: 6422.4 [I.V.:3194.9; NG/GT:2390; IV Piggyback:837.5] Out: 3075 [Urine:3075]  PHYSICAL EXAMINATION: (after reintubation) General:  Well nourished adult male, intubated, inadequately sedated, critically ill HEENT: PERRL sluggish, OP clear, MM moist, Orally intubated Neuro: RASS +3, sitting up in bed and pulling at restraints. Moving RUE and RLE strongly. LUE and LLE still flaccid.  CV: RRR no m/r/g  PULM: CTA b/l  GI: soft, NTND  Extremities: warm/dry, trace BUE edema  Skin: no rashes  LABS:  BMET Recent Labs  Lab 04/25/17 0257  04/26/17 0232  04/26/17 1341 04/26/17 1925 04/27/17 0158  NA  153*  154*   < > 155*  156*   < > 152* 152* 151*  K 3.4*  --  3.2*  --   --   --  2.9*  CL 123*  --  124*  --   --   --  114*  CO2 24  --  25  --   --   --  26  BUN 7  --  10  --   --   --  12  CREATININE 0.96  --  0.91  --   --   --  0.81  GLUCOSE 163*  --  160*  --   --   --  143*   < > = values in this  interval not displayed.   Electrolytes Recent Labs  Lab 04/24/17 0340 04/25/17 0257 04/26/17 0232 04/26/17 0715 04/27/17 0158  CALCIUM 8.5* 8.5* 8.7*  --  8.6*  MG 2.1  --   --  2.1 2.0  PHOS 3.1  --   --  3.0 3.8   CBC Recent Labs  Lab 04/25/17 0257 04/26/17 0232 04/27/17 0158  WBC 13.9* 13.7* 12.0*  HGB 8.5* 7.8* 7.8*  HCT 28.6* 26.8* 25.5*  PLT 281 306 331   Coag's Recent Labs  Lab 09-30-2016 0040  APTT 28  INR 1.23   Sepsis Markers Recent Labs  Lab 04/24/17 1342 04/25/17 0257 04/26/17 0232  LATICACIDVEN 0.9  --   --   PROCALCITON <0.10 <0.10 <0.10   ABG Recent Labs  Lab 04/25/17 0407 04/26/17 0420 04/27/17 0330  PHART 7.354 7.363 7.420  PCO2ART 43.2 45.6 43.1  PO2ART 157* 316* 115*   Liver Enzymes Recent Labs  Lab 09-30-2016 0040  AST 36  ALT 25  ALKPHOS 74  BILITOT 0.7  ALBUMIN 3.9   Cardiac Enzymes No results for input(s): TROPONINI, PROBNP in the last 168 hours.  Glucose Recent Labs  Lab 04/26/17 0730 04/26/17 1153 04/26/17 1600 04/26/17 1938 04/26/17 2335 04/27/17 0355  GLUCAP 115* 152* 169* 133* 150* 165*   Imaging  DISCUSSION: 51 year old male with right thalamus hemorrhage with extension in the the ventricular system & 4mm shift.  UDS + cocaine/TCH, ETOH 40  ASSESSMENT / PLAN:  Acute resp failure in setting of Acute CVA H/O Asthma - no home meds Left mild to mod pneumothorax 11/13- likely from inhaled illegal drug abuse, spontaneous,  - resolved Tobacco Abuse Plan  - SBTs , would advocate for another trial of extubation  - VAP protocol   Aspiration pneumonia: -secretions improved - Sputum culture growing Strep pneumo;dc zosyn, use ceftriaxone x 4ds   HTN Crisis; Cocaine abuse - continues have difficulty with BP control requiring nicardipine gtt - Cardiac Monitoring  - Cardene prn for BP goal < 220/120 - no beta blockers given cocaine use - continue  w/ norvasc and HCTZ  - clonidine 0.1mg  PO q8 -limited by  bradycardia   Hypernatremia - induced by 3% saline  hypokalemia - repleted & recheck , monmitor Mg  Etoh abuse: - will monitor for signs of Etoh withdrawal.     Acute Encephalopathy secondary to Acute CVA  Right Thalamus Acute Hemorrhage with extension into the ventricular system with 5 mm right to left midline shift  ETOH, cocaine, and THC abuse - admission ETOH Level 40  Plan  -  Neurology Primary -  NGSY following, EVD not indicated at this point given stable bleed/ ventricles  -  Frequent Neuro Checks   - 3% saline  per Neuro rec's -currently on hold.  - RASS goal -1/-2  -on max fent gt, precedex limited by brady, Now that we know mental status is intact, will add ativan & titrate - continue MVI, thiamine, and folate  Constipation:  - continue docusate and senna BID - give bisacodyl suppository prn -ct TFs at goal  FAMILY  - Updates: patient's sister at bedside today.  - Complicated family dynamics. Pl see note from 11/16, son IraqJaquan & sister assuming POA -  No formal designation     DVT prophylaxis: SCDs SUP: PPI Diet: NPO/ TF  Activity: bedrest Disposition : ICU   Summary - Hopeful for another trial of extubation at some point once agitation controlled   The patient is critically ill with multiple organ systems failure and requires high complexity decision making for assessment and support, frequent evaluation and titration of therapies, application of advanced monitoring technologies and extensive interpretation of multiple databases. My Critical Care Time devoted to patient care services described in this note  is 35 minutes.   Cyril Mourningakesh Laurell Coalson MD. Tonny BollmanFCCP. [facility] Pager [phone] If no response call [phone]   04/27/2017

## 2017-04-27 NOTE — Plan of Care (Signed)
Patient is on tube feedings. Increasing to goal rate.

## 2017-04-27 NOTE — Progress Notes (Signed)
STROKE TEAM PROGRESS NOTE   SUBJECTIVE (INTERVAL HISTORY) His  sister is at the bedside. He remains agitated requiring fentanyl drip and versed PRN. Temp 100.7 this am. Repeat CT stable. CTA head and neck neg. Na 149 and currently 3% saline discontinued. Still intubated on vent.     OBJECTIVE Temp:  [98.8 F (37.1 C)-100.7 F (38.2 C)] 99 F (37.2 C) (11/17 2010) Pulse Rate:  [45-105] 56 (11/17 2245) Cardiac Rhythm: Sinus bradycardia (11/17 2000) Resp:  [13-26] 16 (11/17 2245) BP: (119-222)/(62-122) 146/95 (11/17 2245) SpO2:  [96 %-100 %] 100 % (11/17 2245) FiO2 (%):  [40 %] 40 % (11/17 2010) Weight:  [164 lb 7.4 oz (74.6 kg)] 164 lb 7.4 oz (74.6 kg) (11/17 0600)  CBC:  Recent Labs  Lab 04/26/17 0232 04/27/17 0158  WBC 13.7* 12.0*  NEUTROABS 10.0* 9.0*  HGB 7.8* 7.8*  HCT 26.8* 25.5*  MCV 97.8 95.5  PLT 306 331    Basic Metabolic Panel:  Recent Labs  Lab 04/26/17 0232 04/26/17 0715  04/27/17 0158  04/27/17 1350 04/27/17 1935  NA 155*  156* 156*   < > 151*   < > 148* 149*  K 3.2*  --   --  2.9*  --   --   --   CL 124*  --   --  114*  --   --   --   CO2 25  --   --  26  --   --   --   GLUCOSE 160*  --   --  143*  --   --   --   BUN 10  --   --  12  --   --   --   CREATININE 0.91  --   --  0.81  --   --   --   CALCIUM 8.7*  --   --  8.6*  --   --   --   MG  --  2.1  --  2.0  --   --   --   PHOS  --  3.0  --  3.8  --   --   --    < > = values in this interval not displayed.    Lipid Panel:     Component Value Date/Time   CHOL 125 04/27/2017 0158   TRIG 122 04/27/2017 0158   HDL 43 04/27/2017 0158   CHOLHDL 2.9 04/27/2017 0158   VLDL 24 04/27/2017 0158   LDLCALC 58 04/27/2017 0158   HgbA1c:  Lab Results  Component Value Date   HGBA1C 4.9 04/30/2017   Urine Drug Screen:     Component Value Date/Time   LABOPIA NONE DETECTED 04/30/17 0615   COCAINSCRNUR POSITIVE (A) April 30, 2017 0615   LABBENZ NONE DETECTED April 30, 2017 0615   AMPHETMU NONE DETECTED  04-30-2017 0615   THCU POSITIVE (A) April 30, 2017 0615   LABBARB NONE DETECTED Apr 30, 2017 0615    Alcohol Level     Component Value Date/Time   ETH 40 (H) 2017/04/30 0133    IMAGING I have personally reviewed the radiological images below and agree with the radiology interpretations.  Ct Angio Head W Or Wo Contrast Ct Angio Neck W Or Wo Contrast 04/27/2017 IMPRESSION:  1. No significant interval change in size of right thalamic intraparenchymal hematoma, estimated volume 25 cc on today's exam. Similar localized mass effect with 4 mm right-to-left shift. Intraventricular extension with stable ventricular dilatation and configuration.  2. Negative CTA of the head and  neck. No vascular abnormality seen underlying the intraparenchymal hematoma.  3. Moderate carotid siphon atherosclerosis without high-grade stenosis. No other high-grade or correctable stenosis within the major arterial vasculature of the head and neck.  4. Layering bilateral pleural effusions.   CT Angio Brain  04/24/2017  1. Overall little interval change in size and appearance of acute intraparenchymal hematoma centered at the left thalamus with intraventricular extension and mild localized midline shift. No significant interval change in size and configuration of the ventricles. 2. Negative CTA with no vascular abnormality underlying the intraparenchymal hematoma identified. 3. Moderate carotid siphon atherosclerosis without high-grade flow-limiting stenosis. No hemodynamically significant or correctable stenosis within the intracranial circulation  TTE pending   PHYSICAL EXAM  Vitals:   04/27/17 2200 04/27/17 2215 04/27/17 2230 04/27/17 2245  BP: (!) 152/86 (!) 196/105 (!) 150/86 (!) 146/95  Pulse: (!) 52 94 (!) 51 (!) 56  Resp: 16 20 15 16   Temp:      TempSrc:      SpO2: 100% 100% 99% 100%  Weight:      Height:         Temp:  [98.8 F (37.1 C)-100.7 F (38.2 C)] 99 F (37.2 C) (11/17 2010) Pulse  Rate:  [45-105] 56 (11/17 2245) Resp:  [13-26] 16 (11/17 2245) BP: (119-222)/(62-122) 146/95 (11/17 2245) SpO2:  [96 %-100 %] 100 % (11/17 2245) FiO2 (%):  [40 %] 40 % (11/17 2010) Weight:  [164 lb 7.4 oz (74.6 kg)] 164 lb 7.4 oz (74.6 kg) (11/17 0600)  General - Well nourished, well developed, intubated.  Ophthalmologic - Fundi not visualized due to noncooperation.  Cardiovascular - Regular rhythm, but bradycardia.  Neuro - intubated and on heavy sedation, not open eyes on voice but opened eyes suctioning. Follows a few simple commands. Quickly closed eyes not responding. PERRL, able to move to both direction, not blinking to visual threat and not tracking. Left corneal weak, right corneal positive. Gag and cough present. Facial symmetry not able to assess due to ET tube. Follows simple commands on the RUE and RLE distally, however, difficult to test due to restrain. LUE and LLE hemiplegic. Positive left babinski. Sensation, coordination and gait not tested.    ASSESSMENT/PLAN Joseph Rubio is a 51 y.o. 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   CTA head and neck negative  2D Echo  pending  LDL 58  HgbA1c - 4.9  UDS + for cocaine and THC  Heparin subq for VTE prophylaxis Diet NPO time specified  No antithrombotic prior to admission, now on No antithrombotic due to ICH  Therapy recommendations:  pending  Disposition:  Pending  Cerebral edema  Repeat CT head showed stable midline shift  Off 3% saline now  Na 149 this am  Hypertension  Unstable  BP goal < 160  On norvasc, clonidine, and HCTZ  Long-term BP goal  normotensive  Cocaine abuse  UDS positive for cocaine  Pt admitted and willing to quick  Anemia, unclear source  Hb 120->9.0->8.5->7.8->7.8  CBC monitoring  Alcohol abuse  On B1/FA/MVI  CIWA protocol.   Other Active Problems  Respiratory failure  Cmarijuana abuse  Pneumonia - IV Rocephin started  04/27/2017  Hypokalemia - 2.9 (on HCTZ) - supplement and recheck in AM  Hospital day # 5  This patient is critically ill and at significant risk of neurological worsening, death and care requires constant monitoring of vital signs, hemodynamics,respiratory and cardiac monitoring, extensive review of multiple databases, frequent neurological assessment, discussion with family, other specialists and medical decision making of high complexity. I had long discussion with sister at bedside, updated pt current condition, treatment plan and potential prognosis. They expressed understanding and appreciation.  I spent 40 minutes of neurocritical care time  in the care of  this patient.   Marvel PlanJindong Ulrick Methot, MD PhD Stroke Neurology 04/27/2017 11:07 PM   To contact Stroke Continuity provider, please refer to [email or web address]. After hours, contact General Neurology

## 2017-04-27 NOTE — Progress Notes (Signed)
Pt very agitated and attempted to self extubate. Pt managed to pull tube about 2 inches and began vomiting. TF stopped and placed on LIS. RN will notify MD and await new orders. RN will continue to monitor and assess closely.

## 2017-04-28 ENCOUNTER — Inpatient Hospital Stay (HOSPITAL_COMMUNITY): Payer: Medicaid Other

## 2017-04-28 DIAGNOSIS — R451 Restlessness and agitation: Secondary | ICD-10-CM

## 2017-04-28 DIAGNOSIS — I5030 Unspecified diastolic (congestive) heart failure: Secondary | ICD-10-CM

## 2017-04-28 LAB — BASIC METABOLIC PANEL
Anion gap: 9 (ref 5–15)
Anion gap: 7 (ref 5–15)
BUN: 17 mg/dL (ref 6–20)
BUN: 18 mg/dL (ref 6–20)
CALCIUM: 8.4 mg/dL — AB (ref 8.9–10.3)
CO2: 29 mmol/L (ref 22–32)
CO2: 29 mmol/L (ref 22–32)
CREATININE: 0.81 mg/dL (ref 0.61–1.24)
CREATININE: 0.78 mg/dL (ref 0.61–1.24)
Calcium: 8.4 mg/dL — ABNORMAL LOW (ref 8.9–10.3)
Chloride: 108 mmol/L (ref 101–111)
Chloride: 107 mmol/L (ref 101–111)
GFR calc Af Amer: >60 mL/min (ref >60)
GFR calc Af Amer: >60 mL/min (ref >60)
GLUCOSE: 142 mg/dL — AB (ref 65–99)
Glucose, Bld: 159 mg/dL — ABNORMAL HIGH (ref 65–99)
POTASSIUM: 3 mmol/L — AB (ref 3.5–5.1)
Potassium: 3.3 mmol/L — ABNORMAL LOW (ref 3.5–5.1)
SODIUM: 146 mmol/L — AB (ref 135–145)
SODIUM: 143 mmol/L (ref 135–145)

## 2017-04-28 LAB — CBC WITH DIFFERENTIAL/PLATELET
BASOS ABS: 0 10*3/uL (ref 0.0–0.1)
Basophils Relative: 0 %
EOS ABS: 0.2 10*3/uL (ref 0.0–0.7)
EOS PCT: 3 %
HCT: 25.9 % — ABNORMAL LOW (ref 39.0–52.0)
Hemoglobin: 7.9 g/dL — ABNORMAL LOW (ref 13.0–17.0)
LYMPHS PCT: 21 %
Lymphs Abs: 1.8 10*3/uL (ref 0.7–4.0)
MCH: 28.5 pg (ref 26.0–34.0)
MCHC: 30.5 g/dL (ref 30.0–36.0)
MCV: 93.5 fL (ref 78.0–100.0)
MONO ABS: 1.5 10*3/uL — AB (ref 0.1–1.0)
Monocytes Relative: 17 %
Neutro Abs: 5.1 10*3/uL (ref 1.7–7.7)
Neutrophils Relative %: 59 %
PLATELETS: 321 10*3/uL (ref 150–400)
RBC: 2.77 MIL/uL — ABNORMAL LOW (ref 4.22–5.81)
RDW: 15.7 % — AB (ref 11.5–15.5)
WBC: 8.6 10*3/uL (ref 4.0–10.5)

## 2017-04-28 LAB — ECHOCARDIOGRAM COMPLETE
Height: 68 in
WEIGHTICAEL: 2585.55 [oz_av]

## 2017-04-28 LAB — BLOOD GAS, ARTERIAL
ACID-BASE EXCESS: 5.5 mmol/L — AB (ref 0.0–2.0)
Bicarbonate: 29.7 mmol/L — ABNORMAL HIGH (ref 20.0–28.0)
FIO2: 40
O2 Saturation: 98.6 %
PEEP: 5 cmH2O
Patient temperature: 98.6
RATE: 16 resp/min
VT: 500 mL
pCO2 arterial: 45.4 mmHg (ref 32.0–48.0)
pH, Arterial: 7.431 (ref 7.350–7.450)
pO2, Arterial: 120 mmHg — ABNORMAL HIGH (ref 83.0–108.0)

## 2017-04-28 LAB — GLUCOSE, CAPILLARY
GLUCOSE-CAPILLARY: 143 mg/dL — AB (ref 65–99)
GLUCOSE-CAPILLARY: 140 mg/dL — AB (ref 65–99)
Glucose-Capillary: 150 mg/dL — ABNORMAL HIGH (ref 65–99)
Glucose-Capillary: 149 mg/dL — ABNORMAL HIGH (ref 65–99)
Glucose-Capillary: 143 mg/dL — ABNORMAL HIGH (ref 65–99)

## 2017-04-28 LAB — SODIUM: SODIUM: 146 mmol/L — AB (ref 135–145)

## 2017-04-28 LAB — PHOSPHORUS: PHOSPHORUS: 4 mg/dL (ref 2.5–4.6)

## 2017-04-28 LAB — MAGNESIUM: MAGNESIUM: 2.2 mg/dL (ref 1.7–2.4)

## 2017-04-28 MED ORDER — PANTOPRAZOLE SODIUM 40 MG PO PACK
40.0000 mg | PACK | Freq: Every day | ORAL | Status: DC
  Administered 2017-04-28 – 2017-04-29 (×2): 40 mg
  Filled 2017-04-28 (×2): qty 20

## 2017-04-28 MED ORDER — PANTOPRAZOLE SODIUM 40 MG PO TBEC: 40.0000 mg | DELAYED_RELEASE_TABLET | Freq: Every day | ORAL | Status: DC

## 2017-04-28 MED ORDER — VITAMIN B-1 100 MG PO TABS
100.0000 mg | ORAL_TABLET | Freq: Every day | ORAL | Status: DC
  Administered 2017-04-28 – 2017-04-29 (×2): 100 mg via ORAL
  Filled 2017-04-28 (×2): qty 1

## 2017-04-28 MED ORDER — POTASSIUM CHLORIDE 20 MEQ/15ML (10%) PO SOLN
30.0000 meq | ORAL | Status: AC
  Administered 2017-04-28 (×2): 30 meq
  Filled 2017-04-28 (×2): qty 30

## 2017-04-28 NOTE — Progress Notes (Signed)
  Echocardiogram 2D Echocardiogram has been performed.  Joseph Rubio T Rand Boller 04/28/2017, 10:20 AM

## 2017-04-28 NOTE — Progress Notes (Signed)
PULMONARY / CRITICAL CARE MEDICINE   Name: Joseph Rubio MRN: [id number] DOB: Nov 03, 1965    ADMISSION DATE:  04/14/2017 CONSULTATION DATE:  04/14/2017  REFERRING MD:  Dr. Otelia LimesLindzen   CHIEF COMPLAINT:  CVA  Brief Patient Summary:  51 year old male with PMH of Asthma on no meds, presented to ED on 11/12 with large posterior basal ganglia and thalamus hemorrhage on the right with intraventricular extension and mass effect upon the right half of the midbrain.  UDS positive for cocaine and THC, ETOH 40.  NSGY consulted for possible EVD drain, deferred due no change in hemorrhage or change in ventricles given x3 head CTs.     STUDIES:  CT Head 11/14>>  1. Overall little interval change in size and appearance of acute intraparenchymal hematoma centered at the left thalamus with intraventricular extension and mild localized midline shift. No significant interval change in size and configuration of the Ventricles. 2. Negative CTA with no vascular abnormality underlying the intraparenchymal hematoma identified. 3. Moderate carotid siphon atherosclerosis without high-grade flow-limiting stenosis. No hemodynamically significant or correctable stenosis within the intracranial circulation. CT Head 11/12 > 1. Acute hemorrhage centered in right thalamus, 21 cc. 2. Extension of hemorrhage into the ventricular system with large volume and right lateral ventricle and small volume within left lateral ventricle and third ventricle. 3. Mass effect with 5 mm right-to-left midline shift and minimal downward compression of the midbrain. 4. Mild hydrocephalus of lateral and third ventricles. CT C Spine 11/12 >  No acute fracture or dislocation of the cervical spine.  CT head 11/12 at 1848 >> Unchanged intraparenchymal hematoma centered in the left thalamus with associated intraventricular extension and 3 mm of leftward midline shift. No new hemorrhage. Unchanged size and configuration of the ventricles  CT  angio head 11/17 no change in hematoma   CULTURES: Sputum culture (11/14): Strep pneumo Blood cultures (11/14): no growth  ANTIBIOTICS: Zosyn 11/14>>11/17 Vanc 11/15>>11/15 ceftx 11/17 >>  SIGNIFICANT EVENTS: 11/12 > Presents to ED, intubated, 3% 11/13 >  Left mild/mod pnx 11/15 > tiny left PTX; RLL pna 11/16> resolution of previously seen PTX; still with mild basilar infiltrates 11/17 extreme agitation, hypertensive, versed added  LINES/TUBES: ETT 11/12 >>11/15 ETT 11/15>> OGT 11/12    SUBJECTIVE:  Low gr Febrile  Less agitation after versed added 11/17 Bradycardic except when agitated  VITAL SIGNS: BP 137/81   Pulse (!) 53   Temp 98.6 F (37 C) (Axillary)   Resp 16   Ht 5\' 8"  (1.727 m)   Wt 161 lb 9.6 oz (73.3 kg)   SpO2 100%   BMI 24.57 kg/m   VENTILATOR SETTINGS: Vent Mode: PRVC FiO2 (%):  [40 %] 40 % Set Rate:  [16 bmp] 16 bmp Vt Set:  [500 mL] 500 mL PEEP:  [5 cmH20] 5 cmH20 Plateau Pressure:  [15 cmH20-20 cmH20] 17 cmH20  INTAKE / OUTPUT: I/O last 3 completed shifts: In: 4077.7 [I.V.:1926.4; NG/GT:1601.3; IV Piggyback:550] Out: 3075 [Urine:3075]  PHYSICAL EXAMINATION: (after reintubation) General:  Well nourished adult male, intubated, sedated, critically ill HEENT: PERRL sluggish, OP clear, MM moist, Orally intubated Neuro: RASS -1. Moving RUE and RLE strongly when agitated. LUE and LLE still flaccid.  CV: RRR no m/r/g  PULM: CTA b/l  GI: soft, NTND  Extremities: warm/dry, trace BUE edema  Skin: no rashes  LABS:  BMET Recent Labs  Lab 04/26/17 0232  04/27/17 0158  04/27/17 1350 04/27/17 1935 04/28/17 0221  NA 155*  156*   < >  151*   < > 148* 149* 146*  K 3.2*  --  2.9*  --   --   --  3.0*  CL 124*  --  114*  --   --   --  108  CO2 25  --  26  --   --   --  29  BUN 10  --  12  --   --   --  17  CREATININE 0.91  --  0.81  --   --   --  0.81  GLUCOSE 160*  --  143*  --   --   --  142*   < > = values in this interval not  displayed.   Electrolytes Recent Labs  Lab 04/26/17 0232 04/26/17 0715 04/27/17 0158 04/28/17 0221  CALCIUM 8.7*  --  8.6* 8.4*  MG  --  2.1 2.0 2.2  PHOS  --  3.0 3.8 4.0   CBC Recent Labs  Lab 04/26/17 0232 04/27/17 0158 04/28/17 0221  WBC 13.7* 12.0* 8.6  HGB 7.8* 7.8* 7.9*  HCT 26.8* 25.5* 25.9*  PLT 306 331 321   Coag's Recent Labs  Lab 05/03/2017 0040  APTT 28  INR 1.23   Sepsis Markers Recent Labs  Lab 04/24/17 1342 04/25/17 0257 04/26/17 0232  LATICACIDVEN 0.9  --   --   PROCALCITON <0.10 <0.10 <0.10   ABG Recent Labs  Lab 04/26/17 0420 04/27/17 0330 04/28/17 0530  PHART 7.363 7.420 7.431  PCO2ART 45.6 43.1 45.4  PO2ART 316* 115* 120*   Liver Enzymes Recent Labs  Lab 04/12/2017 0040  AST 36  ALT 25  ALKPHOS 74  BILITOT 0.7  ALBUMIN 3.9   Cardiac Enzymes No results for input(s): TROPONINI, PROBNP in the last 168 hours.  Glucose Recent Labs  Lab 04/27/17 1141 04/27/17 1551 04/27/17 2028 04/27/17 2325 04/28/17 0335 04/28/17 0657  GLUCAP 154* 126* 118* 131* 143* 140*   Imaging  DISCUSSION: 51 year old male with right thalamus hemorrhage with extension in the the ventricular system & 4mm shift.  UDS + cocaine/TCH, ETOH 40  ASSESSMENT / PLAN:  Acute resp failure in setting of Acute CVA H/O Asthma - no home meds Left mild to mod pneumothorax 11/13- likely from inhaled illegal drug abuse, spontaneous,  - resolved Tobacco Abuse Plan  - SBTs , would advocate for another trial of extubation  - VAP protocol   Aspiration pneumonia: -secretions improved - Sputum culture>>Strep pneumo;dc zosyn, use ceftriaxone x 4ds   HTN Crisis; Cocaine abuse - BP much better controlled with adequate  sedation - Cardiac Monitoring  - Cardene prn for BP goal < 220/120 - no beta blockers given cocaine use - continue  w/ norvasc and HCTZ  - clonidine 0.1mg  PO q8 -limited by bradycardia   Hypernatremia - induced by 3% saline, resolving   hypokalemia - repleted & recheck , monitor Mg      Acute Encephalopathy secondary to Acute CVA  Right Thalamus Acute Hemorrhage with extension into the ventricular system with 5 mm right to left midline shift  ETOH, cocaine, and THC abuse - admission ETOH Level 40 , probable signs of Etoh withdrawal.  Plan  -  Neurology Primary -  NGSY following, EVD not indicated  given stable bleed/ ventricles  -  Frequent Neuro Checks   - 3% saline per Neuro rec's -currently on hold.  - RASS goal -1/-2  -ct fent gtt, precedex limited by brady (not very effective), Since we know mental  status is intact, added ativan via tube & versed gtt  -low rate , ct another 24h then dc - continue MVI, thiamine, and folate  Constipation:  - continue docusate and senna BID - give bisacodyl suppository prn -ct TFs at goal  FAMILY  - Updates: patient's sister at bedside 11/18 - Complicated family dynamics. Pl see note from 11/16, son IraqJaquan & sister assuming POA -  No formal designation     DVT prophylaxis: SCDs SUP: PPI Diet: NPO/ TF  Activity: bedrest Disposition : ICU   Summary - Hopeful for another trial of extubation in next 24h now that BP &  agitation controlled   The patient is critically ill with multiple organ systems failure and requires high complexity decision making for assessment and support, frequent evaluation and titration of therapies, application of advanced monitoring technologies and extensive interpretation of multiple databases. My Critical Care Time devoted to patient care services described in this note  is 32 minutes.   Cyril Mourningakesh Adren Dollins MD. Tonny BollmanFCCP. [facility] Pager [phone] If no response call [phone]   04/28/2017

## 2017-04-28 NOTE — Progress Notes (Signed)
[facility] FOR AM LAB REPLACEMENT ONLY  The patient does apply [facility] the [facility] Protocol based on the criteria listed below:   1. Is GFR >/= 40 ml/min? Yes.    Patient's GFR today is >60 2. Is urine output >/= 0.5 ml/kg/hr for the last 6 hours? Yes.   Patient's UOP is 0.73 ml/kg/hr 3. Is BUN < 60 mg/dL? Yes.    Patient's BUN today is 17 4. Abnormal electrolyte(s): K+=3.0 5. Ordered repletion with: per protocol 6. If a panic level lab has been reported, has the CCM MD in charge been notified? Yes.  .   Physician:  Yetta NumbersSommer,Steve  Oberon Hehir, Micki Rileybby Parker 04/28/2017 4:41 AM

## 2017-04-28 NOTE — Plan of Care (Signed)
Patient resting ok on sedation.

## 2017-04-28 NOTE — Progress Notes (Signed)
STROKE TEAM PROGRESS NOTE   SUBJECTIVE (INTERVAL HISTORY) His sister is at the bedside. He was calm but heavily sedated this time with fentanyl, versed drip and ativan bid and precedex. CCM on board and plan for extubation tomorrow. BP stable, no fever.     OBJECTIVE Temp:  [98.6 F (37 C)-100.7 F (38.2 C)] 98.6 F (37 C) (11/18 0742) Pulse Rate:  [45-105] 53 (11/18 0700) Cardiac Rhythm: Sinus bradycardia (11/17 2000) Resp:  [13-26] 16 (11/18 0700) BP: (119-222)/(62-122) 137/81 (11/18 0700) SpO2:  [99 %-100 %] 100 % (11/18 0700) FiO2 (%):  [40 %] 40 % (11/18 0514) Weight:  [161 lb 9.6 oz (73.3 kg)] 161 lb 9.6 oz (73.3 kg) (11/18 0321)  CBC:  Recent Labs  Lab 04/27/17 0158 04/28/17 0221  WBC 12.0* 8.6  NEUTROABS 9.0* 5.1  HGB 7.8* 7.9*  HCT 25.5* 25.9*  MCV 95.5 93.5  PLT 331 321    Basic Metabolic Panel:  Recent Labs  Lab 04/27/17 0158  04/27/17 1935 04/28/17 0221  NA 151*   < > 149* 146*  K 2.9*  --   --  3.0*  CL 114*  --   --  108  CO2 26  --   --  29  GLUCOSE 143*  --   --  142*  BUN 12  --   --  17  CREATININE 0.81  --   --  0.81  CALCIUM 8.6*  --   --  8.4*  MG 2.0  --   --  2.2  PHOS 3.8  --   --  4.0   < > = values in this interval not displayed.    Lipid Panel:     Component Value Date/Time   CHOL 125 04/27/2017 0158   TRIG 122 04/27/2017 0158   HDL 43 04/27/2017 0158   CHOLHDL 2.9 04/27/2017 0158   VLDL 24 04/27/2017 0158   LDLCALC 58 04/27/2017 0158   HgbA1c:  Lab Results  Component Value Date   HGBA1C 4.9 04/18/2017   Urine Drug Screen:     Component Value Date/Time   LABOPIA NONE DETECTED 04/14/2017 0615   COCAINSCRNUR POSITIVE (A) 04/24/2017 0615   LABBENZ NONE DETECTED 04/12/2017 0615   AMPHETMU NONE DETECTED 04/30/2017 0615   THCU POSITIVE (A) 04/16/2017 0615   LABBARB NONE DETECTED 05/06/2017 0615    Alcohol Level     Component Value Date/Time   ETH 40 (H) 04/20/2017 0133    IMAGING I have personally reviewed the  radiological images below and agree with the radiology interpretations.  Ct Angio Head W Or Wo Contrast Ct Angio Neck W Or Wo Contrast 04/27/2017 IMPRESSION:  1. No significant interval change in size of right thalamic intraparenchymal hematoma, estimated volume 25 cc on today's exam. Similar localized mass effect with 4 mm right-to-left shift. Intraventricular extension with stable ventricular dilatation and configuration.  2. Negative CTA of the head and neck. No vascular abnormality seen underlying the intraparenchymal hematoma.  3. Moderate carotid siphon atherosclerosis without high-grade stenosis. No other high-grade or correctable stenosis within the major arterial vasculature of the head and neck.  4. Layering bilateral pleural effusions.   CT Angio Brain  04/24/2017  1. Overall little interval change in size and appearance of acute intraparenchymal hematoma centered at the left thalamus with intraventricular extension and mild localized midline shift. No significant interval change in size and configuration of the ventricles. 2. Negative CTA with no vascular abnormality underlying the intraparenchymal hematoma identified. 3.  Moderate carotid siphon atherosclerosis without high-grade flow-limiting stenosis. No hemodynamically significant or correctable stenosis within the intracranial circulation  TTE pending   PHYSICAL EXAM  Vitals:   04/28/17 0630 04/28/17 0645 04/28/17 0700 04/28/17 0742  BP: 134/84 128/83 137/81   Pulse: (!) 51 (!) 51 (!) 53   Resp: 16 15 16    Temp:    98.6 F (37 C)  TempSrc:    Axillary  SpO2: 100% 100% 100%   Weight:      Height:         Temp:  [98.6 F (37 C)-100.7 F (38.2 C)] 98.6 F (37 C) (11/18 0742) Pulse Rate:  [45-105] 53 (11/18 0700) Resp:  [13-26] 16 (11/18 0700) BP: (119-222)/(62-122) 137/81 (11/18 0700) SpO2:  [99 %-100 %] 100 % (11/18 0700) FiO2 (%):  [40 %] 40 % (11/18 0514) Weight:  [161 lb 9.6 oz (73.3 kg)] 161 lb  9.6 oz (73.3 kg) (11/18 0321)  General - Well nourished, well developed, intubated and heavily sedated.  Ophthalmologic - Fundi not visualized due to noncooperation.  Cardiovascular - Regular rate and rhythm..  Neuro - intubated and on heavy sedation, not open eyes on voice or pain. Not follows commands. PERRL, not blinking to visual threat and not tracking, no doll's eyes. Left corneal weak, right corneal positive. Gag and cough present. Facial symmetry not able to assess due to ET tube. LUE and LLE hemiplegic, slight withdraw to pain on the RUE and withdraw at least 2/5 RLE. Positive left babinski. Sensation, coordination and gait not tested.    ASSESSMENT/PLAN Mr. Joseph Rubio is a 51 y.o. 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   CTA head and neck negative  2D Echo  pending  LDL 58  HgbA1c - 4.9  UDS + for cocaine and THC  Heparin subq for VTE prophylaxis Diet NPO time specified  No antithrombotic prior to admission, now on No antithrombotic due to ICH  Therapy recommendations:  pending  Disposition:  Pending  Cerebral edema  Repeat CT head showed stable midline shift  Off 3% saline now  Na 149->147 this am  Respiratory failure   Intubated for airway protection  Still agitation once wean off sedation  CCM on board  Plan for extubate in am  Right LL pneumonia ? - on IV Rocephin started 11/17/201  Hypertension  Stable now  BP goal < 160  On norvasc, clonidine, and HCTZ  Long-term BP goal normotensive  Cocaine abuse  UDS positive for cocaine  Pt admitted and willing to quick  Anemia, unclear  source  Hb 120->9.0->8.5->7.8->7.8->7.9  CBC monitoring  Alcohol abuse  On B1/FA/MVI - change to po  CIWA protocol.   Other Active Problems  Cocaine and marijuana abuse  Hypokalemia - 2.9 -> 3.0 (on HCTZ) - supplement and recheck in AM  Hospital day # 6  This patient is critically ill and at significant risk of neurological worsening, death and care requires constant monitoring of vital signs, hemodynamics,respiratory and cardiac monitoring, extensive review of multiple databases, frequent neurological assessment, discussion with family, other specialists and medical decision making of high complexity. I had long discussion with sister at bedside, updated pt current  condition, treatment plan and potential prognosis. They expressed understanding and appreciation.  I spent 35 minutes of neurocritical care time  in the care of  this patient.  Marvel PlanJindong Shaylinn Hladik, MD PhD Stroke Neurology 04/28/2017 9:57 AM   To contact Stroke Continuity provider, please refer to [email or web address]. After hours, contact General Neurology

## 2017-04-28 NOTE — Plan of Care (Signed)
Pt heavily sedated

## 2017-04-28 NOTE — Clinical Social Work Note (Signed)
Clinical Social Work Assessment  Patient Details  Name: Joseph Rubio MRN: [id number] Date of Birth: [birth year]  Date of referral:  04/26/17               Reason for consult:  Facility Placement, Emotional/Coping/Adjustment to Illness, Other (Comment Required)(Decision Making)                Permission sought to share information with:  Family Supports Permission granted to share information::  Yes, Verbal Permission Granted  Name::     Joseph Rubio / Joseph Rubio  Relationship::  Sister / Joseph Rubio Information:  [phone] / [phone]  Housing/Transportation Living arrangements for the past 2 months:  [place] of Information:  Other (Comment Required)(Sibling) Patient Interpreter Needed:  None Criminal Activity/Legal Involvement Pertinent to Current Situation/Hospitalization:  No - Comment as needed Significant Relationships:  Siblings, Adult Children Lives with:  Roommate Do you feel safe going back to the place where you live?  No Need for family participation in patient care:  Yes (Comment)  Care giving concerns:  Patient sister at bedside and states that patient was a "traveler" and most of his children do not have a relationship with him.  Patient sister would like to pursue Medicaid and Disability for patient in [place], however all of his family is in [place] and [place] and will likely make his way there for additional support.   Social Worker assessment / plan:  Holiday representative met with patient sister at bedside to offer support.  Patient sister was able to provide contact information for adult children (4), however CSW only able to locate 2.  Patient son, Joseph Rubio who is at bedside is the only child with a relationship with patient.  Patient other son , Joseph Rubio has said Joseph Rubio can make all decisions on his behalf.  Patient sister is aware that patient will definitely require some rehab and has been down this road in the past with her mother.  Patient sister states that  she is in agreement for patient to complete rehab in [place], but eventually plan will hopefully be for patient to go to [place] or [place] where his family lives and is able to provide additional support.    Patient sister states that patient is a substance user and is very stubborn.  Patient sister is hopeful that patient will recover and this will be a good time to change his "ways."  Patient sister and son agreeable with options like CIR vs. SNF once patient is off the ventilator and appropriate to work with therapies.  CSW remains available for support and to facilitate patient discharge needs once medically stable.  Employment status:  Unemployed Forensic scientist:  Self Pay (Medicaid Pending) PT Recommendations:  Not assessed at this time Information / Referral to community resources:  Other (Comment Required)  Patient/Family's Response to care:  Patient sister is only family available at bedside, however she expressed her sincere gratitude for CSW support and involvement.  Patient sister states that the family is sorry for their reaction towards the staff yesterday and they are just emotionally charged due to patient current condition.  Patient sister plans to speak with patient son, Joseph Rubio about plans moving forward and the potential for moving closer to family once stable.  Patient/Family's Understanding of and Emotional Response to Diagnosis, Current Treatment, and Prognosis:  Patient family is understanding of patient current medical needs, however not realistic in regards to hospital policy and the need for continued efforts  to contact all family.  Patient family is beginning to warm up and build trust and rapport with certain individuals on staff, which will help for care moving forward.  Emotional Assessment Appearance:  Disheveled Attitude/Demeanor/Rapport:  Unable to Assess Affect (typically observed):  Unable to Assess Orientation:  (Currently vented and agitated) Alcohol /  Substance use:  Alcohol Use, Illicit Drugs Psych involvement (Current and /or in the community):  No (Comment)  Discharge Needs  Concerns to be addressed:  Substance Abuse Concerns, Discharge Planning Concerns, Basic Needs Readmission within the last 30 days:  No Current discharge risk:  Substance Abuse, Lack of support system, Physical Impairment, Lives alone Barriers to Discharge:  Continued Medical Work up, Active Substance Use, Inadequate or no insurance  [place], [place]

## 2017-04-29 ENCOUNTER — Inpatient Hospital Stay (HOSPITAL_COMMUNITY): Payer: Medicaid Other

## 2017-04-29 DIAGNOSIS — F1410 Cocaine abuse, uncomplicated: Secondary | ICD-10-CM

## 2017-04-29 DIAGNOSIS — J939 Pneumothorax, unspecified: Secondary | ICD-10-CM

## 2017-04-29 DIAGNOSIS — I10 Essential (primary) hypertension: Secondary | ICD-10-CM

## 2017-04-29 DIAGNOSIS — E785 Hyperlipidemia, unspecified: Secondary | ICD-10-CM

## 2017-04-29 DIAGNOSIS — Z0189 Encounter for other specified special examinations: Secondary | ICD-10-CM

## 2017-04-29 DIAGNOSIS — J9600 Acute respiratory failure, unspecified whether with hypoxia or hypercapnia: Secondary | ICD-10-CM

## 2017-04-29 LAB — CBC WITH DIFFERENTIAL/PLATELET
Basophils Absolute: 0.1 10*3/uL (ref 0.0–0.1)
Basophils Relative: 1 %
Eosinophils Absolute: 0.2 10*3/uL (ref 0.0–0.7)
Eosinophils Relative: 2 %
HEMATOCRIT: 24.7 % — AB (ref 39.0–52.0)
HEMOGLOBIN: 7.4 g/dL — AB (ref 13.0–17.0)
LYMPHS ABS: 2 10*3/uL (ref 0.7–4.0)
LYMPHS PCT: 21 %
MCH: 28.1 pg (ref 26.0–34.0)
MCHC: 30 g/dL (ref 30.0–36.0)
MCV: 93.9 fL (ref 78.0–100.0)
Monocytes Absolute: 1.4 10*3/uL — ABNORMAL HIGH (ref 0.1–1.0)
Monocytes Relative: 15 %
NEUTROS ABS: 5.8 10*3/uL (ref 1.7–7.7)
NEUTROS PCT: 61 %
Platelets: 331 10*3/uL (ref 150–400)
RBC: 2.63 MIL/uL — AB (ref 4.22–5.81)
RDW: 15.8 % — ABNORMAL HIGH (ref 11.5–15.5)
WBC: 9.4 10*3/uL (ref 4.0–10.5)

## 2017-04-29 LAB — BASIC METABOLIC PANEL
Anion gap: 8 (ref 5–15)
BUN: 18 mg/dL (ref 6–20)
CHLORIDE: 107 mmol/L (ref 101–111)
CO2: 28 mmol/L (ref 22–32)
Calcium: 8.4 mg/dL — ABNORMAL LOW (ref 8.9–10.3)
Creatinine, Ser: 0.74 mg/dL (ref 0.61–1.24)
GFR calc non Af Amer: >60 mL/min (ref >60)
Glucose, Bld: 170 mg/dL — ABNORMAL HIGH (ref 65–99)
POTASSIUM: 3.2 mmol/L — AB (ref 3.5–5.1)
SODIUM: 143 mmol/L (ref 135–145)

## 2017-04-29 LAB — CULTURE, BLOOD (ROUTINE X 2)
CULTURE: NO GROWTH
Culture: NO GROWTH
SPECIAL REQUESTS: ADEQUATE
SPECIAL REQUESTS: ADEQUATE

## 2017-04-29 LAB — GLUCOSE, CAPILLARY
GLUCOSE-CAPILLARY: 195 mg/dL — AB (ref 65–99)
GLUCOSE-CAPILLARY: 162 mg/dL — AB (ref 65–99)
Glucose-Capillary: 147 mg/dL — ABNORMAL HIGH (ref 65–99)
Glucose-Capillary: 156 mg/dL — ABNORMAL HIGH (ref 65–99)
Glucose-Capillary: 243 mg/dL — ABNORMAL HIGH (ref 65–99)
Glucose-Capillary: 158 mg/dL — ABNORMAL HIGH (ref 65–99)

## 2017-04-29 LAB — POCT I-STAT 3, ART BLOOD GAS (G3+)
ACID-BASE EXCESS: 10 mmol/L — AB (ref 0.0–2.0)
BICARBONATE: 34.3 mmol/L — AB (ref 20.0–28.0)
O2 SAT: 99 %
PH ART: 7.485 — AB (ref 7.350–7.450)
PO2 ART: 148 mmHg — AB (ref 83.0–108.0)
TCO2: 36 mmol/L — ABNORMAL HIGH (ref 22–32)
pCO2 arterial: 45.6 mmHg (ref 32.0–48.0)

## 2017-04-29 LAB — PHOSPHORUS: Phosphorus: 3.5 mg/dL (ref 2.5–4.6)

## 2017-04-29 LAB — MAGNESIUM: Magnesium: 2.2 mg/dL (ref 1.7–2.4)

## 2017-04-29 MED ORDER — LORAZEPAM 1 MG PO TABS: 2.0000 mg | ORAL_TABLET | Freq: Two times a day (BID) | ORAL | Status: DC

## 2017-04-29 MED ORDER — MANNITOL 25 % IV SOLN
INTRAVENOUS | Status: AC
  Filled 2017-04-29: qty 50

## 2017-04-29 MED ORDER — ORAL CARE MOUTH RINSE: 15.0000 mL | Freq: Two times a day (BID) | OROMUCOSAL | Status: DC

## 2017-04-29 MED ORDER — LORAZEPAM 2 MG/ML IJ SOLN
1.0000 mg | Freq: Four times a day (QID) | INTRAMUSCULAR | Status: DC
  Administered 2017-04-29 (×2): 1 mg via INTRAVENOUS
  Filled 2017-04-29: qty 1

## 2017-04-29 MED ORDER — LORAZEPAM 2 MG/ML IJ SOLN
INTRAMUSCULAR | Status: AC
  Filled 2017-04-29: qty 1

## 2017-04-29 MED ORDER — CLONIDINE HCL 0.1 MG PO TABS: 0.2000 mg | ORAL_TABLET | Freq: Three times a day (TID) | ORAL | Status: DC

## 2017-04-29 MED ORDER — LABETALOL HCL 5 MG/ML IV SOLN
0.5000 mg/min | INTRAVENOUS | Status: DC
  Administered 2017-04-29 (×2): 3 mg/min via INTRAVENOUS
  Administered 2017-04-29: 0.5 mg/min via INTRAVENOUS
  Filled 2017-04-29 (×7): qty 100

## 2017-04-29 MED ORDER — POTASSIUM CHLORIDE 20 MEQ/15ML (10%) PO SOLN
40.0000 meq | Freq: Once | ORAL | Status: AC
Start: 2017-04-29 — End: 2017-04-29
  Administered 2017-04-29: 40 meq
  Filled 2017-04-29: qty 30

## 2017-04-29 MED ORDER — CLEVIDIPINE BUTYRATE 0.5 MG/ML IV EMUL
0.0000 mg/h | INTRAVENOUS | Status: DC
Start: 2017-04-29 — End: 2017-04-30
  Administered 2017-04-29 (×3): 21 mg/h via INTRAVENOUS
  Administered 2017-04-29: 1 mg/h via INTRAVENOUS
  Filled 2017-04-29 (×6): qty 50

## 2017-04-29 MED ORDER — LABETALOL HCL 5 MG/ML IV SOLN
10.0000 mg | Freq: Once | INTRAVENOUS | Status: AC
  Administered 2017-04-29: 10 mg via INTRAVENOUS
  Filled 2017-04-29: qty 4

## 2017-04-29 MED ORDER — CHLORHEXIDINE GLUCONATE 0.12 % MT SOLN
15.0000 mL | Freq: Two times a day (BID) | OROMUCOSAL | Status: DC
  Administered 2017-04-29: 15 mL via OROMUCOSAL

## 2017-04-29 MED ORDER — LACTULOSE 10 GM/15ML PO SOLN: 30.0000 g | Freq: Two times a day (BID) | ORAL | Status: DC

## 2017-04-29 MED ORDER — MANNITOL 25 % IV SOLN
25.0000 g | Freq: Once | INTRAVENOUS | Status: AC
  Administered 2017-04-30: 25 g via INTRAVENOUS

## 2017-04-29 MED ORDER — HYDRALAZINE HCL 20 MG/ML IJ SOLN: 20.0000 mg | INTRAMUSCULAR | Status: DC | PRN

## 2017-04-29 NOTE — Progress Notes (Signed)
Patient extubated per MD's order, placed on 2LNC, SATS 100%, HR 128, RR 24, BP 174/98, no stridor heard, will continue to monitor patient.

## 2017-04-29 NOTE — Progress Notes (Signed)
PT Cancellation Note  Patient Details Name: Joseph Rubio MRN: [id number] DOB: 01/23/1966   Cancelled Treatment:    Reason Eval/Treat Not Completed: Medical issues which prohibited therapy(pt extubated an hour ago with resting HR supine 140 currently and not medically appropriate)   [name] [name] 04/29/2017, 1:06 PM  Delaney MeigsMaija Tabor Sheyanne Munley, PT [phone]

## 2017-04-29 NOTE — Plan of Care (Signed)
Pt still restless and combative since extubation, but so far tolerating extubation well but has tachycardia at 140s and elevated BP 190s. Resumed cleviprex drip but soon maximized. Will increase cleviprex range up to 25 mcg. Discussed with pharmacy, not recommend cardizem this time due to also CCB category. but labetalol drip is OK for pt with cocaine, will start labetalol drip too for BP control and tachycardia. BP goal < 160.   Marvel PlanJindong Valerya Maxton, MD PhD Stroke Neurology 04/29/2017 1:57 PM

## 2017-04-29 NOTE — Progress Notes (Signed)
Nutrition Follow-up  INTERVENTION:   If unable to pass swallow evaluation recommend Cortrak placement and starting enteral nutrition support.    Recommend: Jevity 1.2 @ 60 ml/hr  30 ml Prostat BID Provides: 1928 kcal, 109 grams protein, and 1167 ml free water.   NUTRITION DIAGNOSIS:   Inadequate oral intake related to inability to eat as evidenced by NPO status. Ongoing.   GOAL:   Patient will meet greater than or equal to 90% of their needs Not met.   MONITOR:   Vent status, TF tolerance  ASSESSMENT:   Pt with PMH of asthma admitted with R thalamic hemorrhage with interventricular hemorrhage and 5 mm shift, noted positive ETOH/cocaine.  11/19 just extubated, pt restless and combative with elevated BP and HR per RN. Pt is now on cleviprex 50 ml/hr (2400 kcal)   Medications reviewed and include: colace, folic acid, lactulose, MVI, senokot, thiamine Labs reviewed: K+ 3.2 CBG's: [phone]  Diet Order:  No diet orders on file  EDUCATION NEEDS:   No education needs have been identified at this time  Skin:  Skin Assessment: Reviewed RN Assessment  Last BM:  11/17 smear  Height:   Ht Readings from Last 1 Encounters:  04/13/2017 _0  (1.727 m)    Weight:   Wt Readings from Last 1 Encounters:  04/29/17 167 lb 5.3 oz (75.9 kg)    Ideal Body Weight:  70 kg  BMI:  Body mass index is 25.44 kg/m.  Estimated Nutritional Needs:   Kcal:  1900-2100  Protein:  95-115 grams  Fluid:  >1.9 L/day  Maylon Peppers RD, LDN, CNSC [phone] Pager [phone] After Hours Pager

## 2017-04-29 NOTE — Progress Notes (Signed)
STROKE TEAM PROGRESS NOTE   SUBJECTIVE (INTERVAL HISTORY) His sister is at the bedside. He is on precedex with versed and fentanyl drip but still agitated with tachycardia and high BP. But still following commands as per RN. CCM will decide on possible extubation today.      OBJECTIVE Temp:  [98.8 F (37.1 C)-99.8 F (37.7 C)] 99.7 F (37.6 C) (11/19 0800) Pulse Rate:  [49-145] 131 (11/19 1230) Cardiac Rhythm: Normal sinus rhythm (11/19 0750) Resp:  [14-30] 24 (11/19 1230) BP: (123-207)/(64-104) 194/104 (11/19 1230) SpO2:  [99 %-100 %] 100 % (11/19 1230) FiO2 (%):  [40 %] 40 % (11/19 0324) Weight:  [167 lb 5.3 oz (75.9 kg)] 167 lb 5.3 oz (75.9 kg) (11/19 0358)  CBC:  Recent Labs  Lab 04/28/17 0221 04/29/17 0246  WBC 8.6 9.4  NEUTROABS 5.1 5.8  HGB 7.9* 7.4*  HCT 25.9* 24.7*  MCV 93.5 93.9  PLT 321 331    Basic Metabolic Panel:  Recent Labs  Lab 04/28/17 0221  04/28/17 2050 04/29/17 0246  NA 146*   < > 143 143  K 3.0*  --  3.3* 3.2*  CL 108  --  107 107  CO2 29  --  29 28  GLUCOSE 142*  --  159* 170*  BUN 17  --  18 18  CREATININE 0.81  --  0.78 0.74  CALCIUM 8.4*  --  8.4* 8.4*  MG 2.2  --   --  2.2  PHOS 4.0  --   --  3.5   < > = values in this interval not displayed.    Lipid Panel:     Component Value Date/Time   CHOL 125 04/27/2017 0158   TRIG 122 04/27/2017 0158   HDL 43 04/27/2017 0158   CHOLHDL 2.9 04/27/2017 0158   VLDL 24 04/27/2017 0158   LDLCALC 58 04/27/2017 0158   HgbA1c:  Lab Results  Component Value Date   HGBA1C 4.9 2017-04-28   Urine Drug Screen:     Component Value Date/Time   LABOPIA NONE DETECTED 04-28-2017 0615   COCAINSCRNUR POSITIVE (A) 2017-04-28 0615   LABBENZ NONE DETECTED 2017-04-28 0615   AMPHETMU NONE DETECTED April 28, 2017 0615   THCU POSITIVE (A) 04-28-17 0615   LABBARB NONE DETECTED 04/28/17 0615    Alcohol Level     Component Value Date/Time   ETH 40 (H) 04/28/17 0133    IMAGING I have personally  reviewed the radiological images below and agree with the radiology interpretations.  Ct Angio Head W Or Wo Contrast Ct Angio Neck W Or Wo Contrast 04/27/2017 IMPRESSION:  1. No significant interval change in size of right thalamic intraparenchymal hematoma, estimated volume 25 cc on today's exam. Similar localized mass effect with 4 mm right-to-left shift. Intraventricular extension with stable ventricular dilatation and configuration.  2. Negative CTA of the head and neck. No vascular abnormality seen underlying the intraparenchymal hematoma.  3. Moderate carotid siphon atherosclerosis without high-grade stenosis. No other high-grade or correctable stenosis within the major arterial vasculature of the head and neck.  4. Layering bilateral pleural effusions.   CT Angio Brain  04/24/2017  1. Overall little interval change in size and appearance of acute intraparenchymal hematoma centered at the left thalamus with intraventricular extension and mild localized midline shift. No significant interval change in size and configuration of the ventricles. 2. Negative CTA with no vascular abnormality underlying the intraparenchymal hematoma identified. 3. Moderate carotid siphon atherosclerosis without high-grade flow-limiting stenosis. No hemodynamically  significant or correctable stenosis within the intracranial circulation  TTE - Severe LVH including apical segments. Grade 2 diastolic   dysfunction with elevated filling pressures.   Consider cardiac MRI to evaluate for hypertrophic cardiomyopathy.   PHYSICAL EXAM  Vitals:   04/29/17 1145 04/29/17 1200 04/29/17 1215 04/29/17 1230  BP: (!) 174/98 (!) 177/84 (!) 196/88 (!) 194/104  Pulse: (!) 130 (!) 127 (!) 131 (!) 131  Resp: (!) 21 (!) 25 (!) 30 (!) 24  Temp:      TempSrc:      SpO2: 100% 100% 100% 100%  Weight:      Height:         Temp:  [98.8 F (37.1 C)-99.8 F (37.7 C)] 99.7 F (37.6 C) (11/19 0800) Pulse Rate:  [49-145]  131 (11/19 1230) Resp:  [14-30] 24 (11/19 1230) BP: (123-207)/(64-104) 194/104 (11/19 1230) SpO2:  [99 %-100 %] 100 % (11/19 1230) FiO2 (%):  [40 %] 40 % (11/19 0324) Weight:  [167 lb 5.3 oz (75.9 kg)] 167 lb 5.3 oz (75.9 kg) (11/19 0358)  General - Well nourished, well developed, intubated and heavily sedated.  Ophthalmologic - Fundi not visualized due to noncooperation.  Cardiovascular - Regular rate and rhythm..  Neuro - intubated and on heavy sedation, not open eyes on voice or pain. Not follows commands. PERRL, not blinking to visual threat and not tracking, no doll's eyes. Left corneal weak, right corneal positive. Gag and cough present. Facial symmetry not able to assess due to ET tube. LUE and LLE hemiplegic, slight withdraw to pain on the RUE and withdraw at least 2/5 RLE. Positive left babinski. Sensation, coordination and gait not tested.    ASSESSMENT/PLAN Mr. Joseph Rubio is a 51 y.o. 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   CTA head and neck negative  2D Echo EF 65-70%  LDL 58  HgbA1c - 4.9  UDS + for cocaine and THC  Heparin subq for VTE prophylaxis Diet NPO time specified  No antithrombotic prior to admission, now on No antithrombotic due to ICH  Therapy recommendations:  pending  Disposition:  Pending  Cerebral edema  Repeat CT head showed stable midline shift  Off 3% saline  Na 149->147->143 this am  Respiratory failure   Intubated for airway protection  Still agitation once wean off sedation  CCM on board  Plan for extubate today  Agitation  Tachycardia and elevated BP  On  precedex  Also on versed and fentanyl drip  Ativan bid   CCM on board  Hypertension  Stable now  BP goal < 160  On norvasc, clonidine, and HCTZ cleviprex drip PRN  Try to avoid beta blocker due to cocaine use  Long-term BP goal normotensive  Cocaine abuse  UDS positive for cocaine  Pt admitted and willing to quick  Anemia, unclear source  Hb 120->9.0->8.5->7.8->7.8->7.9->7.4  Stool guaiac pending  CBC monitoring  Alcohol abuse  On B1/FA/MVI - change to po  CIWA protocol  Other Active Problems  Cocaine and marijuana abuse  Hypokalemia - 2.9 -> 3.0 ->3.2  Hospital day # 7  This patient is critically ill and at significant risk of neurological worsening, death and  care requires constant monitoring of vital signs, hemodynamics,respiratory and cardiac monitoring, extensive review of multiple databases, frequent neurological assessment, discussion with family, other specialists and medical decision making of high complexity. I had long discussion with sister at bedside, updated pt current condition, treatment plan and potential prognosis. She expressed understanding and appreciation. I also discussed with Dr. Delcie RochFeinsteine. I spent 35 minutes of neurocritical care time  in the care of  this patient.  Marvel PlanJindong Rosemond Lyttle, MD PhD Stroke Neurology 04/29/2017 12:58 PM   To contact Stroke Continuity provider, please refer to [email or web address]. After hours, contact General Neurology

## 2017-04-29 NOTE — Progress Notes (Addendum)
Pt has fluctuated between restless and combative since wake up assessment this am, BP and HR have been elevated during this time.  Several medications have been added, discontinued, and titrated.  Stroke and CCM MDs have been aware throughout the day that BP parameters have not been met. Most current orders are for Cardizem gtt, waiting on gtt from pharmacy.  Per Pharmacy cardizem gtt is unadvised as pt is already on cleviprex, Labetalol gtt ordered.

## 2017-04-29 NOTE — Progress Notes (Signed)
PULMONARY / CRITICAL CARE MEDICINE   Name: Joseph Rubio MRN: [id number] DOB: [birth year]    ADMISSION DATE:  04/14/2017 CONSULTATION DATE:  04/13/2017  REFERRING MD:  Dr. Otelia LimesLindzen   CHIEF COMPLAINT:  CVA  Brief Patient Summary:  51 year old male with PMH of Asthma on no meds, presented to ED on 11/12 with large posterior basal ganglia and thalamus hemorrhage on the right with intraventricular extension and mass effect upon the right half of the midbrain.  UDS positive for cocaine and THC, ETOH 40.  NSGY consulted for possible EVD drain, deferred due no change in hemorrhage or change in ventricles given x3 head CTs.     STUDIES:  CT Head 11/14>>  1. Overall little interval change in size and appearance of acute intraparenchymal hematoma centered at the left thalamus with intraventricular extension and mild localized midline shift. No significant interval change in size and configuration of the Ventricles. 2. Negative CTA with no vascular abnormality underlying the intraparenchymal hematoma identified. 3. Moderate carotid siphon atherosclerosis without high-grade flow-limiting stenosis. No hemodynamically significant or correctable stenosis within the intracranial circulation. CT Head 11/12 > 1. Acute hemorrhage centered in right thalamus, 21 cc. 2. Extension of hemorrhage into the ventricular system with large volume and right lateral ventricle and small volume within left lateral ventricle and third ventricle. 3. Mass effect with 5 mm right-to-left midline shift and minimal downward compression of the midbrain. 4. Mild hydrocephalus of lateral and third ventricles. CT C Spine 11/12 >  No acute fracture or dislocation of the cervical spine.  CT head 11/12 at 1848 >> Unchanged intraparenchymal hematoma centered in the left thalamus with associated intraventricular extension and 3 mm of leftward midline shift. No new hemorrhage. Unchanged size and configuration of the ventricles  CT  angio head 11/17 no change in hematoma   CULTURES: Sputum culture (11/14): Strep pneumo Blood cultures (11/14): no growth  ANTIBIOTICS: Zosyn 11/14>>11/17 Vanc 11/15>>11/15  SIGNIFICANT EVENTS: 11/12 > Presents to ED, intubated, 3% 11/13 >  Left mild/mod pnx 11/15 > tiny left PTX; RLL pna 11/16> resolution of previously seen PTX; still with mild basilar infiltrates  LINES/TUBES: ETT 11/12 >>11/15 ETT 11/15>> OGT 11/12    SUBJECTIVE:  Agitation with any WUA  VITAL SIGNS: BP (!) 195/96 Comment: pt agitated from WUA, hydralazine given-see F/U  Pulse (!) 101   Temp 99.7 F (37.6 C) (Oral)   Resp 15   Ht 5\' 8"  (1.727 m)   Wt 75.9 kg (167 lb 5.3 oz)   SpO2 100%   BMI 25.44 kg/m   VENTILATOR SETTINGS: Vent Mode: PSV FiO2 (%):  [40 %] 40 % Set Rate:  [16 bmp] 16 bmp Vt Set:  [500 mL] 500 mL PEEP:  [5 cmH20] 5 cmH20 Pressure Support:  [5 cmH20] 5 cmH20 Plateau Pressure:  [19 cmH20-22 cmH20] 19 cmH20  INTAKE / OUTPUT: I/O last 3 completed shifts: In: 4195.2 [I.V.:1603.2; NG/GT:2142; IV Piggyback:450] Out: 2550 [Urine:2550]  PHYSICAL EXAMINATION: (after reintubation) General: rass -2 Neuro: fc., rass -2 HEENT: jvd  evident PULM: ronchi CV: s1 s2 RRT GI: soft, no bm, no r/g Extremities: no edema, no rash   LABS:  BMET Recent Labs  Lab 04/28/17 0221 04/28/17 0802 04/28/17 2050 04/29/17 0246  NA 146* 146* 143 143  K 3.0*  --  3.3* 3.2*  CL 108  --  107 107  CO2 29  --  29 28  BUN 17  --  18 18  CREATININE 0.81  --  0.78 0.74  GLUCOSE 142*  --  159* 170*   Electrolytes Recent Labs  Lab 04/27/17 0158 04/28/17 0221 04/28/17 2050 04/29/17 0246  CALCIUM 8.6* 8.4* 8.4* 8.4*  MG 2.0 2.2  --  2.2  PHOS 3.8 4.0  --  3.5   CBC Recent Labs  Lab 04/27/17 0158 04/28/17 0221 04/29/17 0246  WBC 12.0* 8.6 9.4  HGB 7.8* 7.9* 7.4*  HCT 25.5* 25.9* 24.7*  PLT 331 321 331   Coag's No results for input(s): APTT, INR in the last 168 hours. Sepsis  Markers Recent Labs  Lab 04/24/17 1342 04/25/17 0257 04/26/17 0232  LATICACIDVEN 0.9  --   --   PROCALCITON <0.10 <0.10 <0.10   ABG Recent Labs  Lab 04/27/17 0330 04/28/17 0530 04/29/17 0341  PHART 7.420 7.431 7.485*  PCO2ART 43.1 45.4 45.6  PO2ART 115* 120* 148.0*   Liver Enzymes No results for input(s): AST, ALT, ALKPHOS, BILITOT, ALBUMIN in the last 168 hours. Cardiac Enzymes No results for input(s): TROPONINI, PROBNP in the last 168 hours.  Glucose Recent Labs  Lab 04/28/17 1204 04/28/17 1607 04/28/17 1926 04/28/17 2318 04/29/17 0321 04/29/17 0813  GLUCAP 150* 149* 147* 143* 156* 195*   Imaging  DISCUSSION: 51 year old male with right thalamus hemorrhage with extension in the the ventricular system & 4mm shift.  UDS + cocaine/TCH, ETOH 40  ASSESSMENT / PLAN:  Acute resp failure in setting of Acute CVA H/O Asthma - no home meds Left mild to mod pneumothorax 11/13- likely from inhaled illegal drug abuse, spontaneous,  - resolved Tobacco Abuse Asp concerns Plan  - slight increase rt base abd bases int changes -if able to reduce sedation then for cpap 5 ps 5, goal 30 min  -may need to wean on heavier sedation with close observation tv, apnea alarms -repeat pccxr in am  -favor neg balance -abg reviewed, reduce MV, if remains on vent  HTN Crisis; Cocaine abuse Tachy  -if drip needed, would consider cardizem -avoiding BB with risk unoppossed alpha - Cardiac Monitoring  - no beta blockers given cocaine use - continue  w/ norvasc and HCTZ  - clonidine 0.1mg  PO q8 -limited by bradycardia -hydral prn, may need to dc if tachy worsen  RENAL Hypernatremia - induced by 3% saline- now off  hypokalemia  -k supp No free water  Etoh abuse: - will monitor for signs of Etoh withdrawa NO CIWA in icu ativan  Acute Encephalopathy secondary to Acute CVA  Right Thalamus Acute Hemorrhage with extension into the ventricular system with 5 mm right to left midline  shift  ETOH, cocaine, and THC abuse - admission ETOH Level 40  Plan  -  Neurology Primary -  NGSY following -  Frequent Neuro Checks   - RASS goal -1/-2  - continue MVI, thiamine, and folate -need to max precedex and dc versed drip, increase ativan, add prn aggressive versed -if needed could change precedex to prop and re assess tolerance  Constipation:  - continue docusate and senna BID - give bisacodyl suppository prn- provide today -add lactulose -ct TFs at goal  ID recephin consider total 7 days then dc , including vanc , zosyn  FAMILY  - Updates: patient's sister at bedside today. By me - Complicated family dynamics. Pl see note from 11/16, son IraqJaquan & sister assuming POA -  No formal designation   Ccm time 35 min   Mcarthur RossettiDaniel J. Tyson AliasFeinstein, MD, FACP Pgr: [phone] [facility] 04/29/2017 10:06  AM

## 2017-04-30 ENCOUNTER — Inpatient Hospital Stay (HOSPITAL_COMMUNITY): Payer: Medicaid Other

## 2017-04-30 ENCOUNTER — Other Ambulatory Visit (HOSPITAL_COMMUNITY): Payer: Medicaid Other

## 2017-04-30 ENCOUNTER — Other Ambulatory Visit (HOSPITAL_COMMUNITY): Payer: Self-pay

## 2017-04-30 DIAGNOSIS — G9382 Brain death: Secondary | ICD-10-CM

## 2017-04-30 LAB — POCT I-STAT 3, ART BLOOD GAS (G3+)
ACID-BASE EXCESS: 6 mmol/L — AB (ref 0.0–2.0)
ACID-BASE EXCESS: 1 mmol/L (ref 0.0–2.0)
ACID-BASE EXCESS: 5 mmol/L — AB (ref 0.0–2.0)
ACID-BASE EXCESS: 5 mmol/L — AB (ref 0.0–2.0)
ACID-BASE EXCESS: 1 mmol/L (ref 0.0–2.0)
ACID-BASE EXCESS: 2 mmol/L (ref 0.0–2.0)
Acid-Base Excess: 10 mmol/L — ABNORMAL HIGH (ref 0.0–2.0)
BICARBONATE: 31.2 mmol/L — AB (ref 20.0–28.0)
BICARBONATE: 25 mmol/L (ref 20.0–28.0)
BICARBONATE: 26.3 mmol/L (ref 20.0–28.0)
Bicarbonate: 31.5 mmol/L — ABNORMAL HIGH (ref 20.0–28.0)
Bicarbonate: 29.7 mmol/L — ABNORMAL HIGH (ref 20.0–28.0)
Bicarbonate: 28.4 mmol/L — ABNORMAL HIGH (ref 20.0–28.0)
Bicarbonate: 31.2 mmol/L — ABNORMAL HIGH (ref 20.0–28.0)
O2 SAT: 100 %
O2 SAT: 100 %
O2 SAT: 99 %
O2 SAT: 100 %
O2 Saturation: 100 %
O2 Saturation: 100 %
O2 Saturation: 100 %
PCO2 ART: 29.1 mmHg — AB (ref 32.0–48.0)
PO2 ART: 188 mmHg — AB (ref 83.0–108.0)
PO2 ART: 169 mmHg — AB (ref 83.0–108.0)
Patient temperature: 99
Patient temperature: 97.9
Patient temperature: 97.9
Patient temperature: 93
TCO2: 32 mmol/L (ref 22–32)
TCO2: 31 mmol/L (ref 22–32)
TCO2: 30 mmol/L (ref 22–32)
TCO2: 33 mmol/L — AB (ref 22–32)
TCO2: 33 mmol/L — AB (ref 22–32)
TCO2: 26 mmol/L (ref 22–32)
TCO2: 28 mmol/L (ref 22–32)
pCO2 arterial: 38.7 mmHg (ref 32.0–48.0)
pCO2 arterial: 54.5 mmHg — ABNORMAL HIGH (ref 32.0–48.0)
pCO2 arterial: 60.9 mmHg — ABNORMAL HIGH (ref 32.0–48.0)
pCO2 arterial: 62.1 mmHg — ABNORMAL HIGH (ref 32.0–48.0)
pCO2 arterial: 31.6 mmHg — ABNORMAL LOW (ref 32.0–48.0)
pCO2 arterial: 37.2 mmHg (ref 32.0–48.0)
pH, Arterial: 7.642 (ref 7.350–7.450)
pH, Arterial: 7.491 — ABNORMAL HIGH (ref 7.350–7.450)
pH, Arterial: 7.275 — ABNORMAL LOW (ref 7.350–7.450)
pH, Arterial: 7.307 — ABNORMAL LOW (ref 7.350–7.450)
pH, Arterial: 7.363 (ref 7.350–7.450)
pH, Arterial: 7.493 — ABNORMAL HIGH (ref 7.350–7.450)
pH, Arterial: 7.452 — ABNORMAL HIGH (ref 7.350–7.450)
pO2, Arterial: 197 mmHg — ABNORMAL HIGH (ref 83.0–108.0)
pO2, Arterial: 160 mmHg — ABNORMAL HIGH (ref 83.0–108.0)
pO2, Arterial: 448 mmHg — ABNORMAL HIGH (ref 83.0–108.0)
pO2, Arterial: 456 mmHg — ABNORMAL HIGH (ref 83.0–108.0)
pO2, Arterial: 337 mmHg — ABNORMAL HIGH (ref 83.0–108.0)

## 2017-04-30 LAB — BILIRUBIN, DIRECT

## 2017-04-30 LAB — BASIC METABOLIC PANEL
ANION GAP: 14 (ref 5–15)
ANION GAP: 6 (ref 5–15)
Anion gap: 6 (ref 5–15)
BUN: 19 mg/dL (ref 6–20)
BUN: 26 mg/dL — ABNORMAL HIGH (ref 6–20)
BUN: 27 mg/dL — AB (ref 6–20)
CALCIUM: 8.7 mg/dL — AB (ref 8.9–10.3)
CALCIUM: 8.6 mg/dL — AB (ref 8.9–10.3)
CHLORIDE: 127 mmol/L — AB (ref 101–111)
CO2: 25 mmol/L (ref 22–32)
CO2: 24 mmol/L (ref 22–32)
CO2: 23 mmol/L (ref 22–32)
CREATININE: 1.06 mg/dL (ref 0.61–1.24)
Calcium: 8.3 mg/dL — ABNORMAL LOW (ref 8.9–10.3)
Chloride: 99 mmol/L — ABNORMAL LOW (ref 101–111)
Chloride: 126 mmol/L — ABNORMAL HIGH (ref 101–111)
Creatinine, Ser: 1.02 mg/dL (ref 0.61–1.24)
Creatinine, Ser: 1.06 mg/dL (ref 0.61–1.24)
GFR calc Af Amer: >60 mL/min (ref >60)
GFR calc non Af Amer: >60 mL/min (ref >60)
GFR calc non Af Amer: >60 mL/min (ref >60)
GLUCOSE: 106 mg/dL — AB (ref 65–99)
GLUCOSE: 100 mg/dL — AB (ref 65–99)
Glucose, Bld: 187 mg/dL — ABNORMAL HIGH (ref 65–99)
POTASSIUM: 2.9 mmol/L — AB (ref 3.5–5.1)
Potassium: 2.8 mmol/L — ABNORMAL LOW (ref 3.5–5.1)
Potassium: 3.1 mmol/L — ABNORMAL LOW (ref 3.5–5.1)
Sodium: 138 mmol/L (ref 135–145)
Sodium: 156 mmol/L — ABNORMAL HIGH (ref 135–145)
Sodium: 156 mmol/L — ABNORMAL HIGH (ref 135–145)

## 2017-04-30 LAB — URINALYSIS, COMPLETE (UACMP) WITH MICROSCOPIC
BILIRUBIN URINE: NEGATIVE
GLUCOSE, UA: NEGATIVE mg/dL
HGB URINE DIPSTICK: NEGATIVE
Ketones, ur: NEGATIVE mg/dL
LEUKOCYTES UA: NEGATIVE
NITRITE: NEGATIVE
PH: 5 (ref 5.0–8.0)
Protein, ur: NEGATIVE mg/dL
SPECIFIC GRAVITY, URINE: 1.006 (ref 1.005–1.030)
Squamous Epithelial / LPF: NONE SEEN

## 2017-04-30 LAB — CBC
HEMATOCRIT: 24.1 % — AB (ref 39.0–52.0)
HEMOGLOBIN: 7.3 g/dL — AB (ref 13.0–17.0)
MCH: 27.8 pg (ref 26.0–34.0)
MCHC: 30.3 g/dL (ref 30.0–36.0)
MCV: 91.6 fL (ref 78.0–100.0)
Platelets: 386 10*3/uL (ref 150–400)
RBC: 2.63 MIL/uL — AB (ref 4.22–5.81)
RDW: 16 % — ABNORMAL HIGH (ref 11.5–15.5)
WBC: 11 10*3/uL — ABNORMAL HIGH (ref 4.0–10.5)

## 2017-04-30 LAB — EXPECTORATED SPUTUM ASSESSMENT W REFEX TO RESP CULTURE

## 2017-04-30 LAB — GLUCOSE, CAPILLARY
GLUCOSE-CAPILLARY: 128 mg/dL — AB (ref 65–99)
GLUCOSE-CAPILLARY: 106 mg/dL — AB (ref 65–99)
Glucose-Capillary: 110 mg/dL — ABNORMAL HIGH (ref 65–99)
Glucose-Capillary: 175 mg/dL — ABNORMAL HIGH (ref 65–99)
Glucose-Capillary: 82 mg/dL (ref 65–99)
Glucose-Capillary: 97 mg/dL (ref 65–99)

## 2017-04-30 LAB — CBC WITH DIFFERENTIAL/PLATELET
BASOS PCT: 0 %
Basophils Absolute: 0 10*3/uL (ref 0.0–0.1)
Eosinophils Absolute: 0.1 10*3/uL (ref 0.0–0.7)
Eosinophils Relative: 1 %
HEMATOCRIT: 25.4 % — AB (ref 39.0–52.0)
Hemoglobin: 7.9 g/dL — ABNORMAL LOW (ref 13.0–17.0)
Lymphocytes Relative: 13 %
Lymphs Abs: 1.4 10*3/uL (ref 0.7–4.0)
MCH: 28.3 pg (ref 26.0–34.0)
MCHC: 31.1 g/dL (ref 30.0–36.0)
MCV: 91 fL (ref 78.0–100.0)
MONO ABS: 1 10*3/uL (ref 0.1–1.0)
MONOS PCT: 10 %
NEUTROS ABS: 8.2 10*3/uL — AB (ref 1.7–7.7)
Neutrophils Relative %: 76 %
Platelets: 401 10*3/uL — ABNORMAL HIGH (ref 150–400)
RBC: 2.79 MIL/uL — ABNORMAL LOW (ref 4.22–5.81)
RDW: 15.9 % — ABNORMAL HIGH (ref 11.5–15.5)
WBC: 10.7 10*3/uL — ABNORMAL HIGH (ref 4.0–10.5)

## 2017-04-30 LAB — COMPREHENSIVE METABOLIC PANEL
ALT: 27 U/L (ref 17–63)
AST: 30 U/L (ref 15–41)
Albumin: 2.1 g/dL — ABNORMAL LOW (ref 3.5–5.0)
Alkaline Phosphatase: 46 U/L (ref 38–126)
Anion gap: 4 — ABNORMAL LOW (ref 5–15)
BUN: 27 mg/dL — AB (ref 6–20)
CALCIUM: 8.4 mg/dL — AB (ref 8.9–10.3)
CO2: 24 mmol/L (ref 22–32)
CREATININE: 1.04 mg/dL (ref 0.61–1.24)
Chloride: 129 mmol/L — ABNORMAL HIGH (ref 101–111)
GFR calc Af Amer: >60 mL/min (ref >60)
Glucose, Bld: 101 mg/dL — ABNORMAL HIGH (ref 65–99)
Potassium: 3 mmol/L — ABNORMAL LOW (ref 3.5–5.1)
SODIUM: 157 mmol/L — AB (ref 135–145)
TOTAL PROTEIN: 6 g/dL — AB (ref 6.5–8.1)
Total Bilirubin: 0.5 mg/dL (ref 0.3–1.2)

## 2017-04-30 LAB — DIFFERENTIAL
Basophils Absolute: 0.1 10*3/uL (ref 0.0–0.1)
Basophils Relative: 1 %
EOS ABS: 0.2 10*3/uL (ref 0.0–0.7)
EOS PCT: 2 %
LYMPHS ABS: 1.1 10*3/uL (ref 0.7–4.0)
Lymphocytes Relative: 10 %
MONO ABS: 1 10*3/uL (ref 0.1–1.0)
Monocytes Relative: 9 %
NEUTROS PCT: 80 %
Neutro Abs: 8.7 10*3/uL — ABNORMAL HIGH (ref 1.7–7.7)

## 2017-04-30 LAB — PHOSPHORUS: PHOSPHORUS: 4.6 mg/dL (ref 2.5–4.6)

## 2017-04-30 LAB — AMMONIA: AMMONIA: 27 umol/L (ref 9–35)

## 2017-04-30 LAB — EXPECTORATED SPUTUM ASSESSMENT W GRAM STAIN, RFLX TO RESP C

## 2017-04-30 LAB — SODIUM
SODIUM: 137 mmol/L (ref 135–145)
Sodium: 148 mmol/L — ABNORMAL HIGH (ref 135–145)
Sodium: 154 mmol/L — ABNORMAL HIGH (ref 135–145)

## 2017-04-30 LAB — APTT: aPTT: 33 seconds (ref 24–36)

## 2017-04-30 LAB — PROTIME-INR
INR: 1.17
PROTHROMBIN TIME: 14.8 s (ref 11.4–15.2)

## 2017-04-30 LAB — GAMMA GT: GGT: 78 U/L — AB (ref 7–50)

## 2017-04-30 LAB — ABO/RH: ABO/RH(D): B POS

## 2017-04-30 LAB — PREPARE RBC (CROSSMATCH)

## 2017-04-30 LAB — TROPONIN I: TROPONIN I: 0.08 ng/mL — AB (ref <0.03)

## 2017-04-30 LAB — MAGNESIUM: Magnesium: 2.2 mg/dL (ref 1.7–2.4)

## 2017-04-30 MED ORDER — SODIUM CHLORIDE 0.9 % IV SOLN
Freq: Once | INTRAVENOUS | Status: AC
  Administered 2017-04-30: via INTRAVENOUS

## 2017-04-30 MED ORDER — POTASSIUM CHLORIDE 10 MEQ/100ML IV SOLN
10.0000 meq | INTRAVENOUS | Status: AC
  Administered 2017-04-30 (×4): 10 meq via INTRAVENOUS
  Filled 2017-04-30 (×4): qty 100

## 2017-04-30 MED ORDER — PHENYLEPHRINE HCL 10 MG/ML IJ SOLN
0.0000 ug/min | INTRAMUSCULAR | Status: AC
  Administered 2017-04-30: 20 ug/min via INTRAVENOUS
  Filled 2017-04-30: qty 1

## 2017-04-30 MED ORDER — SODIUM CHLORIDE 3 % IV SOLN
INTRAVENOUS | Status: DC
  Administered 2017-04-30: 75 mL/h via INTRAVENOUS
  Filled 2017-04-30 (×3): qty 500

## 2017-04-30 MED ORDER — ORAL CARE MOUTH RINSE
15.0000 mL | OROMUCOSAL | Status: DC
  Administered 2017-04-30 – 2017-05-01 (×13): 15 mL via OROMUCOSAL

## 2017-04-30 MED ORDER — SODIUM CHLORIDE 0.9 % IV SOLN: INTRAVENOUS | Status: DC | PRN

## 2017-04-30 MED ORDER — SODIUM CHLORIDE 0.9 % IV SOLN
30.0000 ug/min | INTRAVENOUS | Status: DC
  Administered 2017-04-30: 30 ug/min via INTRAVENOUS
  Filled 2017-04-30 (×3): qty 4

## 2017-04-30 MED ORDER — CHLORHEXIDINE GLUCONATE 0.12 % MT SOLN
OROMUCOSAL | Status: AC
  Filled 2017-04-30: qty 15

## 2017-04-30 MED ORDER — POTASSIUM CHLORIDE 20 MEQ/15ML (10%) PO SOLN: 40.0000 meq | Freq: Once | ORAL | Status: DC

## 2017-04-30 MED ORDER — CHLORHEXIDINE GLUCONATE 0.12% ORAL RINSE (MEDLINE KIT)
15.0000 mL | Freq: Two times a day (BID) | OROMUCOSAL | Status: DC
  Administered 2017-04-30 – 2017-05-01 (×3): 15 mL via OROMUCOSAL

## 2017-04-30 MED ORDER — SODIUM CHLORIDE 0.45 % IV SOLN
INTRAVENOUS | Status: DC
  Administered 2017-04-30: via INTRAVENOUS
  Filled 2017-04-30 (×2): qty 1000

## 2017-04-30 MED ORDER — SODIUM CHLORIDE 0.45 % IV SOLN
INTRAVENOUS | Status: DC
  Administered 2017-04-30: 19:00:00 via INTRAVENOUS

## 2017-04-30 NOTE — Consult Note (Deleted)
I was called by the nurse at 12:15 the patient pupils were fixed and dilated, patient was unresponsive and was no longer moving right hemibody. This is a significant change from 11 PM and the patient was very agitated, moaning and moving his right side extremities. He was extubated earlier today and was noted to be hypertensive and claviprex and labetalol was started.  25 g of mannitol was administered immediately and patient was intubated for airway protection. A repeat CT head was performed which did not show an interval increase in the size of hemorrhage, however patient did have hydrocephalus which appears stable when compared to prior CT. However acute neurological change can occur from increased ICP from hydrocephalus and neurosurgery was consulted. An and EVD was placed, which has been draining well.  Pressure has been within time meters of less than 160 systolic. Critical care team was notified to assist in ventilator management. A repeat CT head has been ordered for the morning.Patient has been started on 3% normal saline at 75 mL per hour.   I spoke to the sister over the phone regarding his neurological change. I was told family came to the hospital, however the sister was very upset and left the hospital immediately before I was able to talk to family.   

## 2017-04-30 NOTE — Op Note (Signed)
Date of procedure: 04/30/2017  Date of dictation: Same  Service: Neurosurgery  Preoperative diagnosis: Obstructive hydrocephalus secondary to intraventricular hemorrhage  Postoperative diagnosis: Same  Procedure Name: Left frontal ventriculostomy  Surgeon:Braya Habermehl A.Solveig Fangman, M.D.  Asst. Surgeon: None  Anesthesia: General  Indication: 51 year old male with hypertensive basal ganglia hemorrhage and secondary intra-articular hemorrhage with worsening obstructive hydrocephalus  Operative note: Patient is a left frontal scalp prepped and draped sterilely. Local lidocaine with epinephrine infiltrated into the planned incision site. Linear incision made 1 cm anterior to the coronal suture on the mid pupillary line. Twist drill hole made. Dura pierced with a spinal needle. The tracheostomy catheter entered into the lateral ventricle with good return of CSF under pressure. Ventriculostomy sutured in place. Connected to an external drainage system. Sterile dressing applied. No apparent complications. Immediately exam unchanged.

## 2017-04-30 NOTE — Procedures (Signed)
Intubation Procedure Note Joseph Rubio [id number] February 26, 1966  Procedure: Intubation Indications: Respiratory insufficiency  Procedure Details Consent: Unable to obtain consent because of emergent medical necessity. Time Out: Verified patient identification, verified procedure, site/side was marked, verified correct patient position, special equipment/implants available, medications/allergies/relevent history reviewed, required imaging and test results available.  Performed  Maximum sterile technique was used including antiseptics, gloves and hand hygiene.  MAC and 3    Evaluation Hemodynamic Status: BP stable throughout; O2 sats: stable throughout Patient's Current Condition: stable Complications: No apparent complications Patient did tolerate procedure well. Chest X-ray ordered to verify placement.  CXR: pending.  Patient was emergently intubated by this RT for respiratory insufficieny and unresponsiveness. Bilateral BS noted per CRNA, positive color change noted per RT. CXR pending. No complications noted throughout intubation procedure.    Leigh Aurora, BS, RRT, RCP 04/30/2017

## 2017-04-30 NOTE — Progress Notes (Signed)
STROKE TEAM PROGRESS NOTE   SUBJECTIVE (INTERVAL HISTORY) His sister and son are at the bedside. He had dramatic decline over night. He was found to have neuro changes at midnight with fixed and dilated pupils, unresponsive and no movement. However, at 11pm pt still agitated, moaning and moving right side. Mannitol given and repeat CT showed no significant change from prior. However, due to concerns of elevated ICP, EVD was placed and 3% saline restarted. However, at 5am repeat head CT showed diffuse brain edema and this am clinical exam consistent with clinical brain death. Apnea test is being planned.     OBJECTIVE Temp:  [97.9 F (36.6 C)-101.4 F (38.6 C)] 97.9 F (36.6 C) (11/20 0800) Pulse Rate:  [70-133] 77 (11/20 1300) Cardiac Rhythm: Sinus tachycardia (11/20 0400) Resp:  [5-37] 11 (11/20 1300) BP: (76-171)/(54-92) 76/54 (11/20 1300) SpO2:  [91 %-100 %] 100 % (11/20 1300) FiO2 (%):  [40 %-100 %] 40 % (11/20 1218) Weight:  [155 lb 10.3 oz (70.6 kg)] 155 lb 10.3 oz (70.6 kg) (11/20 0500)  CBC:  Recent Labs  Lab 04/29/17 0246 04/30/17 0148  WBC 9.4 10.7*  NEUTROABS 5.8 8.2*  HGB 7.4* 7.9*  HCT 24.7* 25.4*  MCV 93.9 91.0  PLT 331 401*    Basic Metabolic Panel:  Recent Labs  Lab 04/29/17 0246  04/30/17 0148 04/30/17 0638 04/30/17 1058  NA 143   < > 138 148* 154*  K 3.2*  --  2.8*  --   --   CL 107  --  99*  --   --   CO2 28  --  25  --   --   GLUCOSE 170*  --  187*  --   --   BUN 18  --  19  --   --   CREATININE 0.74  --  1.02  --   --   CALCIUM 8.4*  --  8.7*  --   --   MG 2.2  --  2.2  --   --   PHOS 3.5  --  4.6  --   --    < > = values in this interval not displayed.    Lipid Panel:     Component Value Date/Time   CHOL 125 04/27/2017 0158   TRIG 122 04/27/2017 0158   HDL 43 04/27/2017 0158   CHOLHDL 2.9 04/27/2017 0158   VLDL 24 04/27/2017 0158   LDLCALC 58 04/27/2017 0158   HgbA1c:  Lab Results  Component Value Date   HGBA1C 4.9 04/30/2017    Urine Drug Screen:     Component Value Date/Time   LABOPIA NONE DETECTED 04/13/2017 0615   COCAINSCRNUR POSITIVE (A) 04/26/2017 0615   LABBENZ NONE DETECTED 04/12/2017 0615   AMPHETMU NONE DETECTED 05/10/2017 0615   THCU POSITIVE (A) 05/06/2017 0615   LABBARB NONE DETECTED 04/12/2017 0615    Alcohol Level     Component Value Date/Time   ETH 40 (H) 04/12/2017 0133    IMAGING I have personally reviewed the radiological images below and agree with the radiology interpretations.  Ct Angio Head W Or Wo Contrast Ct Angio Neck W Or Wo Contrast 04/27/2017 IMPRESSION:  1. No significant interval change in size of right thalamic intraparenchymal hematoma, estimated volume 25 cc on today's exam. Similar localized mass effect with 4 mm right-to-left shift. Intraventricular extension with stable ventricular dilatation and configuration.  2. Negative CTA of the head and neck. No vascular abnormality seen underlying the intraparenchymal hematoma.  3. Moderate carotid siphon atherosclerosis without high-grade stenosis. No other high-grade or correctable stenosis within the major arterial vasculature of the head and neck.  4. Layering bilateral pleural effusions.   CT Angio Brain  04/24/2017  1. Overall little interval change in size and appearance of acute intraparenchymal hematoma centered at the left thalamus with intraventricular extension and mild localized midline shift. No significant interval change in size and configuration of the ventricles. 2. Negative CTA with no vascular abnormality underlying the intraparenchymal hematoma identified. 3. Moderate carotid siphon atherosclerosis without high-grade flow-limiting stenosis. No hemodynamically significant or correctable stenosis within the intracranial circulation  TTE - Severe LVH including apical segments. Grade 2 diastolic   dysfunction with elevated filling pressures.   Consider cardiac MRI to evaluate for hypertrophic  cardiomyopathy.  Ct Head Wo Contrast 04/30/2017 IMPRESSION: 1. Interval development of diffuse cerebral edema with elevated intracranial pressure and central downward herniation. Foramen magnum stenosis. 2. New left frontal ventriculostomy with decompressed ventricles. 3. Stable right thalamic and intraventricular hemorrhage.    04/30/2017 IMPRESSION: 1. Interval decrease in size of right thalamic intraparenchymal hematoma, estimated volume 20 CC on today's examination, previously 25 cc. Slightly decreased regional mass effect with similar 4 mm of right-to-left shift. Intraventricular extension with stable ventricular dilatation and configuration. 2. No other new acute intracranial abnormality.     PHYSICAL EXAM  Vitals:   04/30/17 1100 04/30/17 1200 04/30/17 1218 04/30/17 1300  BP: 96/62 95/60 (!) 79/61 (!) 76/54  Pulse: 79 77 71 77  Resp: 16 16  11   Temp:      TempSrc:      SpO2: 100% 100% 100% 100%  Weight:      Height:         Temp:  [97.9 F (36.6 C)-101.4 F (38.6 C)] 97.9 F (36.6 C) (11/20 0800) Pulse Rate:  [70-133] 77 (11/20 1300) Resp:  [5-37] 11 (11/20 1300) BP: (76-171)/(54-92) 76/54 (11/20 1300) SpO2:  [91 %-100 %] 100 % (11/20 1300) FiO2 (%):  [40 %-100 %] 40 % (11/20 1218) Weight:  [155 lb 10.3 oz (70.6 kg)] 155 lb 10.3 oz (70.6 kg) (11/20 0500)  General - Well nourished, well developed, intubated with no sedation.  Cardiovascular - Regular rate and rhythm.  Neuro - intubated with no sedation, pt not responsive. Not open eyes with pain or voice. No spontaneous movement. Pupils 6mm, fixed, no pupillary, corneal, gag or cough reflexes, no doll's eyes, cold caloric testing no response. Not breathing over the vent. On pain stimulation, no movement in all extremities. DTR diminished, no babinski.   ASSESSMENT/PLAN Mr. Leonides GrillsJeffery L Jentsch is a 51 y.o. 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   CTA head and neck negative  2D Echo EF 65-70%  LDL 58  HgbA1c - 4.9  UDS + for cocaine and THC  Cerebral edema  3% saline restarted  Na 149->147->143->138-> 154 this am  CT diffuse edema with herniation likely associated with brain death  Respiratory failure   Re-intubated overnight  CCM on board  Plan for extubate tomorrow after family visit  Hypotension  BP trending low  Continue monitoring  Cocaine and marijuana abuse  UDS positive for cocaine and [facility]  Medical examiner informed  Anemia, unclear source  Hb 120->9.0->8.5->7.8->7.8->7.9->7.4->7.9  Alcohol abuse  Was on B1/FA/MVI   Other Active Problems  Hypokalemia - 2.9 -> 3.0 ->3.2->2.8  Hospital day # 8  This patient is critically ill and had dramatic change overnight and now clinical brain death. I had long discussion with son and sister at bedside, updated pt current condition, and informed further confirmation test with apnea test. They expressed understanding. I also discussed with Dr. Delcie RochFeinsteine. I spent 50 minutes of neurocritical care time  in the care of  this patient.  Marvel PlanJindong Bristol Osentoski, MD PhD Stroke Neurology 04/30/2017 1:47 PM   To contact Stroke Continuity provider, please refer to  [email or web address]. After hours, contact General Neurology

## 2017-04-30 NOTE — Progress Notes (Signed)
Apnea test performed at this time with MD at bedside. 10 Lpm O2 tubing inserted down ETT with cuff deflated and subglottic disconnected. No observation of chest rise during the 10 minutes. First ABG obtained at 2 minutes, second ABG obtained at 6 minutes and the last ABG obtained at 9 minutes at which this time we placed patient back on ventilator and reinflated the cuff. Vitals remained stable throughout procedure.

## 2017-04-30 NOTE — Progress Notes (Signed)
Pt transported to and from CT w/o complications. Uneventful trip. 100% FiO2.  

## 2017-04-30 NOTE — Procedures (Signed)
Arterial Catheter Insertion Procedure Note Joseph Rubio [id number] 1965/10/29  Procedure: Insertion of Arterial Catheter  Indications: Blood pressure monitoring and Frequent blood sampling  Procedure Details Consent: Risks of procedure as well as the alternatives and risks of each were explained to the (patient/caregiver).  Consent for procedure obtained. Time Out: Verified patient identification, verified procedure, site/side was marked, verified correct patient position, special equipment/implants available, medications/allergies/relevent history reviewed, required imaging and test results available.  Performed  Maximum sterile technique was used including antiseptics, cap, gloves, gown, hand hygiene, mask and sheet. Skin prep: Chlorhexidine; local anesthetic administered 20 gauge catheter was inserted into left radial artery using the Seldinger technique.  Evaluation Blood flow good; BP tracing good. Complications: No apparent complications.   Joseph Rubio, Joseph Rubio 04/30/2017

## 2017-04-30 NOTE — Progress Notes (Signed)
   04/30/17 0900  Clinical Encounter Type  Visited With Family  Visit Type Spiritual support  Consult/Referral To Chaplain  Spiritual Encounters  Spiritual Needs Grief support  Chaplain spoke to son of PT Joseph Rubio, he has a firm understanding that time has worn short for his father.  He received a call last evening that PT stroked again last evening.  Emotionally he understands where he is with his father the PT and has been accepting of this.  He had high praise of the staff and everything they have been able to do.

## 2017-04-30 NOTE — Progress Notes (Signed)
Vent changes made per CDS request following ABG.  Pt placed on 100% for 30 minutes with a follow up ABG ordered.

## 2017-04-30 NOTE — Progress Notes (Signed)
I was called by the nurse at 12:15 the patient pupils were fixed and dilated, patient was unresponsive and was no longer moving right hemibody. This is a significant change from 11 PM and the patient was very agitated, moaning and moving his right side extremities. He was extubated earlier today and was noted to be hypertensive and claviprex and labetalol was started.  25 g of mannitol was administered immediately and patient was intubated for airway protection. A repeat CT head was performed which did not show an interval increase in the size of hemorrhage, however patient did have hydrocephalus which appears stable when compared to prior CT. However acute neurological change can occur from increased ICP from hydrocephalus and neurosurgery was consulted. An and EVD was placed, which has been draining well.  Pressure has been within time meters of less than 160 systolic. Critical care team was notified to assist in ventilator management. A repeat CT head has been ordered for the morning.Patient has been started on 3% normal saline at 75 mL per hour.   I spoke to the sister over the phone regarding his neurological change. I was told family came to the hospital, however the sister was very upset and left the hospital immediately before I was able to talk to family.

## 2017-04-30 NOTE — Progress Notes (Signed)
At 0000 neuro check, patient found with snoring respirations, unresponsive, pupils with no reaction, L > R. Patient respirations increased with sternal rub,then patient became apneic. This nurse bagged patient with bag valve mask. Rapid response initially called, then rapid response RN called code blue to obtain help for intubation. RRT Gurney [name] emergently intubated patient. CRNA arrived to bedside and confirmed bilateral breath sounds and color change. Neurologist at bedside. Patient then taken to stat head CT after intubation. Neurosurgery to place IVC drain. Family called multiple times with no answer. Charge nurse [place], RN spoke with sister when she called back and informed her of patient's change in status while this nurse in CT with patient. Still no answer from son, message left.  Will continue to monitor patient.

## 2017-04-30 NOTE — Progress Notes (Signed)
PCCM Progress Note  Patient is a 51 year old male with thalamic hemorraghe with ventricular extension that was extubated earlier today. Patient has been very agitated through out the day. Around 12AM PM patient neurostatus worsened requiring emergent intubation done by RT. CTH shows hydrocephalus. Emergent EVD placed.  Along with mannitol patient was also hyperventilated for a short period of time due to concern for increased ICP. Patient is s/p EVD. Will D/C hyperventilation and place on previous vent settings.

## 2017-04-30 NOTE — Consult Note (Signed)
Reason for Consult: Hydrocephalus Referring Physician: Neurology  Joseph Rubio is an 51 y.o. male.  HPI: 51 year old male admitted with right basal ganglia hypertensive hemorrhage with secondary intraventricular hemorrhage. Patient with acute neurologic decline this evening. Required resuscitation and intubation. 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 I have been called emergently for evaluation for possible ventriculostomy.  No past medical history on file.  No past surgical history on file.  No family history on file.  Social History:  reports that he has been smoking.  He does not have any smokeless tobacco history on file. He reports that he drinks alcohol. His drug history is not on file.  Allergies: No Known Allergies  Medications: I have reviewed the patient's current medications.  Results for orders placed or performed during the hospital encounter of 04/20/2017 (from the past 48 hour(s))  Basic metabolic panel     Status: Abnormal   Collection Time: 04/28/17  2:21 AM  Result Value Ref Range   Sodium 146 (H) 135 - 145 mmol/L   Potassium 3.0 (L) 3.5 - 5.1 mmol/L   Chloride 108 101 - 111 mmol/L   CO2 29 22 - 32 mmol/L   Glucose, Bld 142 (H) 65 - 99 mg/dL   BUN 17 6 - 20 mg/dL   Creatinine, Ser 0.81 0.61 - 1.24 mg/dL   Calcium 8.4 (L) 8.9 - 10.3 mg/dL   GFR calc non Af Amer >60 >60 mL/min   GFR calc Af Amer >60 >60 mL/min    Comment: (NOTE) The eGFR has been calculated using the CKD EPI equation. This calculation has not been validated in all clinical situations. eGFR's persistently <60 mL/min signify possible Chronic Kidney Disease.    Anion gap 9 5 - 15  CBC with Differential/Platelet     Status: Abnormal   Collection Time: 04/28/17  2:21 AM  Result Value Ref Range   WBC 8.6 4.0 - 10.5 K/uL   RBC 2.77 (L) 4.22 - 5.81 MIL/uL   Hemoglobin 7.9 (L) 13.0 - 17.0 g/dL   HCT 25.9 (L) 39.0 - 52.0 %   MCV 93.5 78.0 - 100.0 fL    MCH 28.5 26.0 - 34.0 pg   MCHC 30.5 30.0 - 36.0 g/dL   RDW 15.7 (H) 11.5 - 15.5 %   Platelets 321 150 - 400 K/uL   Neutrophils Relative % 59 %   Neutro Abs 5.1 1.7 - 7.7 K/uL   Lymphocytes Relative 21 %   Lymphs Abs 1.8 0.7 - 4.0 K/uL   Monocytes Relative 17 %   Monocytes Absolute 1.5 (H) 0.1 - 1.0 K/uL   Eosinophils Relative 3 %   Eosinophils Absolute 0.2 0.0 - 0.7 K/uL   Basophils Relative 0 %   Basophils Absolute 0.0 0.0 - 0.1 K/uL  Magnesium     Status: None   Collection Time: 04/28/17  2:21 AM  Result Value Ref Range   Magnesium 2.2 1.7 - 2.4 mg/dL  Phosphorus     Status: None   Collection Time: 04/28/17  2:21 AM  Result Value Ref Range   Phosphorus 4.0 2.5 - 4.6 mg/dL  Glucose, capillary     Status: Abnormal   Collection Time: 04/28/17  3:35 AM  Result Value Ref Range   Glucose-Capillary 143 (H) 65 - 99 mg/dL  Blood gas, arterial     Status: Abnormal   Collection Time: 04/28/17  5:30 AM  Result Value Ref Range   FIO2 40.00  Delivery systems VENTILATOR    Mode PRESSURE REGULATED VOLUME CONTROL    VT 500 mL   LHR 16 resp/min   Peep/cpap 5.0 cm H20   pH, Arterial 7.431 7.350 - 7.450   pCO2 arterial 45.4 32.0 - 48.0 mmHg   pO2, Arterial 120 (H) 83.0 - 108.0 mmHg   Bicarbonate 29.7 (H) 20.0 - 28.0 mmol/L   Acid-Base Excess 5.5 (H) 0.0 - 2.0 mmol/L   O2 Saturation 98.6 %   Patient temperature 98.6    Collection site LEFT RADIAL    Drawn by ARTERIAL DRAW    Sample type ARTERIAL DRAW    Allens test (pass/fail) PASS PASS  Glucose, capillary     Status: Abnormal   Collection Time: 04/28/17  6:57 AM  Result Value Ref Range   Glucose-Capillary 140 (H) 65 - 99 mg/dL  Sodium     Status: Abnormal   Collection Time: 04/28/17  8:02 AM  Result Value Ref Range   Sodium 146 (H) 135 - 145 mmol/L  Glucose, capillary     Status: Abnormal   Collection Time: 04/28/17 12:04 PM  Result Value Ref Range   Glucose-Capillary 150 (H) 65 - 99 mg/dL  Glucose, capillary     Status:  Abnormal   Collection Time: 04/28/17  4:07 PM  Result Value Ref Range   Glucose-Capillary 149 (H) 65 - 99 mg/dL  Glucose, capillary     Status: Abnormal   Collection Time: 04/28/17  7:26 PM  Result Value Ref Range   Glucose-Capillary 147 (H) 65 - 99 mg/dL   Comment 1 QC Due   BMET today at 2000     Status: Abnormal   Collection Time: 04/28/17  8:50 PM  Result Value Ref Range   Sodium 143 135 - 145 mmol/L   Potassium 3.3 (L) 3.5 - 5.1 mmol/L   Chloride 107 101 - 111 mmol/L   CO2 29 22 - 32 mmol/L   Glucose, Bld 159 (H) 65 - 99 mg/dL   BUN 18 6 - 20 mg/dL   Creatinine, Ser 0.78 0.61 - 1.24 mg/dL   Calcium 8.4 (L) 8.9 - 10.3 mg/dL   GFR calc non Af Amer >60 >60 mL/min   GFR calc Af Amer >60 >60 mL/min    Comment: (NOTE) The eGFR has been calculated using the CKD EPI equation. This calculation has not been validated in all clinical situations. eGFR's persistently <60 mL/min signify possible Chronic Kidney Disease.    Anion gap 7 5 - 15  Glucose, capillary     Status: Abnormal   Collection Time: 04/28/17 11:18 PM  Result Value Ref Range   Glucose-Capillary 143 (H) 65 - 99 mg/dL  Basic metabolic panel     Status: Abnormal   Collection Time: 04/29/17  2:46 AM  Result Value Ref Range   Sodium 143 135 - 145 mmol/L   Potassium 3.2 (L) 3.5 - 5.1 mmol/L   Chloride 107 101 - 111 mmol/L   CO2 28 22 - 32 mmol/L   Glucose, Bld 170 (H) 65 - 99 mg/dL   BUN 18 6 - 20 mg/dL   Creatinine, Ser 0.74 0.61 - 1.24 mg/dL   Calcium 8.4 (L) 8.9 - 10.3 mg/dL   GFR calc non Af Amer >60 >60 mL/min   GFR calc Af Amer >60 >60 mL/min    Comment: (NOTE) The eGFR has been calculated using the CKD EPI equation. This calculation has not been validated in all clinical situations. eGFR's persistently <60 mL/min  signify possible Chronic Kidney Disease.    Anion gap 8 5 - 15  CBC with Differential/Platelet     Status: Abnormal   Collection Time: 04/29/17  2:46 AM  Result Value Ref Range   WBC 9.4 4.0 -  10.5 K/uL   RBC 2.63 (L) 4.22 - 5.81 MIL/uL   Hemoglobin 7.4 (L) 13.0 - 17.0 g/dL   HCT 24.7 (L) 39.0 - 52.0 %   MCV 93.9 78.0 - 100.0 fL   MCH 28.1 26.0 - 34.0 pg   MCHC 30.0 30.0 - 36.0 g/dL   RDW 15.8 (H) 11.5 - 15.5 %   Platelets 331 150 - 400 K/uL   Neutrophils Relative % 61 %   Neutro Abs 5.8 1.7 - 7.7 K/uL   Lymphocytes Relative 21 %   Lymphs Abs 2.0 0.7 - 4.0 K/uL   Monocytes Relative 15 %   Monocytes Absolute 1.4 (H) 0.1 - 1.0 K/uL   Eosinophils Relative 2 %   Eosinophils Absolute 0.2 0.0 - 0.7 K/uL   Basophils Relative 1 %   Basophils Absolute 0.1 0.0 - 0.1 K/uL  Magnesium     Status: None   Collection Time: 04/29/17  2:46 AM  Result Value Ref Range   Magnesium 2.2 1.7 - 2.4 mg/dL  Phosphorus     Status: None   Collection Time: 04/29/17  2:46 AM  Result Value Ref Range   Phosphorus 3.5 2.5 - 4.6 mg/dL  Glucose, capillary     Status: Abnormal   Collection Time: 04/29/17  3:21 AM  Result Value Ref Range   Glucose-Capillary 156 (H) 65 - 99 mg/dL  I-STAT 3, arterial blood gas (G3+)     Status: Abnormal   Collection Time: 04/29/17  3:41 AM  Result Value Ref Range   pH, Arterial 7.485 (H) 7.350 - 7.450   pCO2 arterial 45.6 32.0 - 48.0 mmHg   pO2, Arterial 148.0 (H) 83.0 - 108.0 mmHg   Bicarbonate 34.3 (H) 20.0 - 28.0 mmol/L   TCO2 36 (H) 22 - 32 mmol/L   O2 Saturation 99.0 %   Acid-Base Excess 10.0 (H) 0.0 - 2.0 mmol/L   Patient temperature 99.1 F    Collection site RADIAL, ALLEN'S TEST ACCEPTABLE    Drawn by RT    Sample type ARTERIAL   Glucose, capillary     Status: Abnormal   Collection Time: 04/29/17  8:13 AM  Result Value Ref Range   Glucose-Capillary 195 (H) 65 - 99 mg/dL   Comment 1 Notify RN    Comment 2 Document in Chart   Glucose, capillary     Status: Abnormal   Collection Time: 04/29/17 11:52 AM  Result Value Ref Range   Glucose-Capillary 243 (H) 65 - 99 mg/dL   Comment 1 Notify RN    Comment 2 Document in Chart   Glucose, capillary     Status:  Abnormal   Collection Time: 04/29/17  3:35 PM  Result Value Ref Range   Glucose-Capillary 158 (H) 65 - 99 mg/dL   Comment 1 Notify RN    Comment 2 Document in Chart   Glucose, capillary     Status: Abnormal   Collection Time: 04/29/17  8:02 PM  Result Value Ref Range   Glucose-Capillary 162 (H) 65 - 99 mg/dL  Glucose, capillary     Status: Abnormal   Collection Time: 04/30/17 12:03 AM  Result Value Ref Range   Glucose-Capillary 175 (H) 65 - 99 mg/dL  Sodium  Status: None   Collection Time: 04/30/17 12:11 AM  Result Value Ref Range   Sodium 137 135 - 145 mmol/L    Ct Head Wo Contrast  Result Date: 04/30/2017 CLINICAL DATA:  Follow-up examination for stroke. EXAM: CT HEAD WITHOUT CONTRAST TECHNIQUE: Contiguous axial images were obtained from the base of the skull through the vertex without intravenous contrast. COMPARISON:  Prior CT from 04/26/2017. FINDINGS: Brain: Intraparenchymal hemorrhage centered at the right thalamus is decreased in size now measuring 2.7 x 4.3 x 3.5 cm (estimated volume 20 cc , previously 25 cc). Similar to slightly decreased localized edema and regional mass effect. 4 mm of localized right-to-left shift not significantly changed. Intraventricular extension with blood in the lateral and third ventricles. Relatively unchanged ventricular dilatation and ventricular morphology. No other new acute intracranial hemorrhage. No acute large vessel territory infarct. Small remote lacunar infarct noted within the left thalamus. No mass lesion. No extra-axial fluid collection. Vascular: No asymmetric hyperdense vessel. Scattered vascular calcifications noted within the carotid siphons. Skull: Scalp soft tissues and calvarium are within normal limits, and unchanged. Sinuses/Orbits: Globes and orbital soft tissues demonstrate no acute abnormality. Chronic left maxillary and ethmoidal sinus disease noted. Small amount of layering fluid noted within the left sphenoid sinus. Mastoid  air cells are clear. Other: None. IMPRESSION: 1. Interval decrease in size of right thalamic intraparenchymal hematoma, estimated volume 20 CC on today's examination, previously 25 cc. Slightly decreased regional mass effect with similar 4 mm of right-to-left shift. Intraventricular extension with stable ventricular dilatation and configuration. 2. No other new acute intracranial abnormality. Electronically Signed   By: Jeannine Boga M.D.   On: 04/30/2017 01:02   Dg Chest Port 1 View  Result Date: 04/29/2017 CLINICAL DATA:  Respiratory failure. EXAM: PORTABLE CHEST 1 VIEW COMPARISON:  04/28/2017 and 04/27/2017 FINDINGS: 0612 hours. Endotracheal tube tip 5.0 cm above the base the carina. The NG tube passes into the stomach although the distal tip position is not included on the film. Bibasilar atelectasis slightly progressed in the interval. Vascular congestion again noted. The visualized bony structures of the thorax are intact. Telemetry leads overlie the chest. IMPRESSION: Interval increase in basilar atelectasis with persistent vascular congestion. Electronically Signed   By: Misty Stanley M.D.   On: 04/29/2017 07:32   Dg Chest Port 1 View  Result Date: 04/28/2017 CLINICAL DATA:  Acute respiratory failure. EXAM: PORTABLE CHEST 1 VIEW COMPARISON:  04/27/2017 FINDINGS: Endotracheal tube is stable. NG tube courses off the inferior border the film. The heart is mildly enlarged, exaggerated by low lung volumes. Mild edema is stable. Right basilar airspace disease likely reflects atelectasis, unchanged. IMPRESSION: 1. Support apparatus stable. 2. The low lung volumes. 3. Persistent medial right lower lobe opacity, likely atelectasis. Electronically Signed   By: San Morelle M.D.   On: 04/28/2017 08:37    Review of systems not obtained due to patient factors. Blood pressure 120/81, pulse (!) 116, temperature 99 F (37.2 C), temperature source Axillary, resp. rate (!) 28, height '5\' 8"'$  (1.727  m), weight 75.9 kg (167 lb 5.3 oz), SpO2 100 %. Patient is unconscious on the ventilator. He exhibits no eye opening to noxious stimuli. Pupils are 8 mm and fixed bilaterally. Corneal reflexes absent bilaterally. Oculocephalic reflexes absent bilaterally. Cough, gag reflex is absent. No evidence of rest for effort. Currently normotensive.  Assessment/Plan: Patient with basal ganglia hemorrhage and secondary obstructive hydrocephalus with acute neurologic worsening. Patient with no current evidence of brain or brainstem function. We  will proceed emergently with ventriculostomy placement in hopes this may improve his situation. Prognosis is grave.  Kahliya Fraleigh A Tacy Chavis 04/30/2017, 1:24 AM

## 2017-04-30 NOTE — Progress Notes (Addendum)
PULMONARY / CRITICAL CARE MEDICINE   Name: Joseph Rubio MRN: [id number] DOB: 06/18/1965    ADMISSION DATE:  2016-07-25 CONSULTATION DATE:  2016-07-25  REFERRING MD:  Dr. Otelia LimesLindzen   CHIEF COMPLAINT:  CVA  Brief Patient Summary:  51 year old male with PMH of Asthma on no meds, presented to ED on 11/12 with large posterior basal ganglia and thalamus hemorrhage on the right with intraventricular extension and mass effect upon the right half of the midbrain.  UDS positive for cocaine and THC, ETOH 40.  NSGY consulted for possible EVD drain, deferred due no change in hemorrhage or change in ventricles given x3 head CTs.     STUDIES:  CT Head 11/14>>  1. Overall little interval change in size and appearance of acute intraparenchymal hematoma centered at the left thalamus with intraventricular extension and mild localized midline shift. No significant interval change in size and configuration of the Ventricles. 2. Negative CTA with no vascular abnormality underlying the intraparenchymal hematoma identified. 3. Moderate carotid siphon atherosclerosis without high-grade flow-limiting stenosis. No hemodynamically significant or correctable stenosis within the intracranial circulation. CT Head 11/12 > 1. Acute hemorrhage centered in right thalamus, 21 cc. 2. Extension of hemorrhage into the ventricular system with large volume and right lateral ventricle and small volume within left lateral ventricle and third ventricle. 3. Mass effect with 5 mm right-to-left midline shift and minimal downward compression of the midbrain. 4. Mild hydrocephalus of lateral and third ventricles. CT C Spine 11/12 >  No acute fracture or dislocation of the cervical spine.  CT head 11/12 at 1848 >> Unchanged intraparenchymal hematoma centered in the left thalamus with associated intraventricular extension and 3 mm of leftward midline shift. No new hemorrhage. Unchanged size and configuration of the ventricles  CT  angio head 11/17 no change in hematoma Ct head 11/20 am>>>Interval decrease in size of right thalamic intraparenchymal hematoma, estimated volume 20 CC on today's examination, previously 25 cc. Slightly decreased regional mass effect with similar 4 mm of right-to-left shift. Intraventricular extension with stable ventricular dilatation and configuration.  CT head post drain 11/20>>>Interval development of diffuse cerebral edema with elevated intracranial pressure and central downward herniation. Foramen magnum stenosis. 2. New left frontal ventriculostomy with decompressed ventricles. 3. Stable right thalamic and intraventricular hemorrhage.   CULTURES: Sputum culture (11/14): Strep pneumo Blood cultures (11/14): no growth  ANTIBIOTICS: Zosyn 11/14>>11/17 Vanc 11/15>>11/15  SIGNIFICANT EVENTS: 11/12 > Presents to ED, intubated, 3% 11/13 >  Left mild/mod pnx 11/15 > tiny left PTX; RLL pna 11/16> resolution of previously seen PTX; still with mild basilar infiltrates 11/19 - extubated successfully, agitation 11/20- am hours, sudden change in Neurostatus, hydro on CT, drain  Placed, ett placed  LINES/TUBES: ETT 11/12 >>11/15 ETT 11/15>>11/19 ett 11/20>>> OGT 11/12   SUBJECTIVE:  Vented Drain placed  VITAL SIGNS: BP 99/61   Pulse 86   Temp 98.8 F (37.1 C) (Axillary)   Resp 16   Ht 5\' 8"  (1.727 m)   Wt 70.6 kg (155 lb 10.3 oz)   SpO2 100%   BMI 23.67 kg/m   VENTILATOR SETTINGS: Vent Mode: PRVC FiO2 (%):  [50 %-100 %] 50 % Set Rate:  [16 bmp-28 bmp] 16 bmp Vt Set:  [500 mL] 500 mL PEEP:  [5 cmH20] 5 cmH20 Pressure Support:  [5 cmH20] 5 cmH20 Plateau Pressure:  [18 cmH20-20 cmH20] 20 cmH20  INTAKE / OUTPUT: I/O last 3 completed shifts: In: 3637.9 [I.V.:2532.9; NG/GT:1105] Out: 5393 [Urine:5350; Drains:43]  PHYSICAL  EXAMINATION: (after reintubation) General: not responsive Neuro: gcs 3, no cough, no gag, pup not reactive HEENT: ett, jvd wnl PULM:  reduced CV:  s1 s2 RRB GI: soft, bs low , no r Extremities:  No edema    LABS:  BMET Recent Labs  Lab 04/28/17 0221  04/28/17 2050 04/29/17 0246 04/30/17 0011 04/30/17 0638  NA 146*   < > 143 143 137 148*  K 3.0*  --  3.3* 3.2*  --   --   CL 108  --  107 107  --   --   CO2 29  --  29 28  --   --   BUN 17  --  18 18  --   --   CREATININE 0.81  --  0.78 0.74  --   --   GLUCOSE 142*  --  159* 170*  --   --    < > = values in this interval not displayed.   Electrolytes Recent Labs  Lab 04/28/17 0221 04/28/17 2050 04/29/17 0246 04/30/17 0148  CALCIUM 8.4* 8.4* 8.4*  --   MG 2.2  --  2.2 2.2  PHOS 4.0  --  3.5 4.6   CBC Recent Labs  Lab 04/28/17 0221 04/29/17 0246 04/30/17 0148  WBC 8.6 9.4 10.7*  HGB 7.9* 7.4* 7.9*  HCT 25.9* 24.7* 25.4*  PLT 321 331 401*   Coag's No results for input(s): APTT, INR in the last 168 hours. Sepsis Markers Recent Labs  Lab 04/24/17 1342 04/25/17 0257 04/26/17 0232  LATICACIDVEN 0.9  --   --   PROCALCITON <0.10 <0.10 <0.10   ABG Recent Labs  Lab 04/28/17 0530 04/29/17 0341 04/30/17 0400  PHART 7.431 7.485* 7.642*  PCO2ART 45.4 45.6 29.1*  PO2ART 120* 148.0* 188.0*   Liver Enzymes No results for input(s): AST, ALT, ALKPHOS, BILITOT, ALBUMIN in the last 168 hours. Cardiac Enzymes No results for input(s): TROPONINI, PROBNP in the last 168 hours.  Glucose Recent Labs  Lab 04/29/17 0813 04/29/17 1152 04/29/17 1535 04/29/17 2002 04/30/17 0003 04/30/17 0333  GLUCAP 195* 243* 158* 162* 175* 128*   Imaging  DISCUSSION: 51 year old male with right thalamus hemorrhage with extension in the the ventricular system & 4mm shift.  UDS + cocaine/TCH, ETOH 40 11/20 am hours hydro, ett,drain  ASSESSMENT / PLAN:  Acute resp failure in setting of Acute CVA H/O Asthma - no home meds Left mild to mod pneumothorax 11/13- likely from inhaled illegal drug abuse, spontaneous,  - resolved Tobacco Abuse Asp concerns Plan   -no role hyperventilation -abg now on lower vent settings -no SBT -avoid any hypercapnia / acidosis -abg in am  -pcxr in am  -pcxr I reviewed without infiltrate well defined  HTN Crisis; Cocaine abuse -avoiding BB with risk unoppossed alpha - labetalol ok with alpha blockade also - tele - continue  w/ norvasc and HCTZ  - clonidine 0.1mg  PO q8 - dc -would allow MAPto rise, goal 75 for autoregulation  RENAL Hypernatremia - induced by 3% saline- now off  hypokalemia  -k supp, ensure done at 2 am -3% -bmet q12h -at risk DI, follow output, get osm if rise  Acute Encephalopathy secondary to Acute CVA  Right Thalamus Acute Hemorrhage with extension into the ventricular system with 5 mm right to left midline shift  ETOH, cocaine, and THC abuse New event with hydro, drain, brain edema - admission ETOH Level 40  Plan  -  Neurology Primary -  NGSY following -  Unclear event, vasospasm? -For MRI -may need brain death testing, apnea testing Dc all sedation, done  ID recephin pcxr improved  FAMILY  - Updates: by me 11/20 - Complicated family dynamics. Pl see note from 11/16, son IraqJaquan & sister assuming POA -  No formal designation   Ccm time 35 min   Mcarthur RossettiDaniel J. Tyson AliasFeinstein, MD, FACP Pgr: [phone] [facility] 04/30/2017 8:18 AM   Update  Exam c/w brain death Pupils fixed dilated No movement pain stimuli No dolls eyes No corneals No effort breathing Exam c/w brain death  Apnea performed by me 8 min NO resp effort Co2 rose by more then 20  Declared dead 12:16 Will make family aware, they stepped out to home and will return Mcarthur RossettiDaniel J. Tyson AliasFeinstein, MD, FACP Pgr: [phone] [facility]   I updated son on phone

## 2017-04-30 NOTE — Progress Notes (Signed)
Nutrition Follow-up  INTERVENTION:   If desired recommend resume Vital AF 1.2 @ 65 ml/hr (1560 ml/day) Provides: 1872 kcal, 117 grams protein, and 1265 ml free water.   NUTRITION DIAGNOSIS:   Inadequate oral intake related to inability to eat as evidenced by NPO status. Ongoing.   GOAL:   Patient will meet greater than or equal to 90% of their needs Progressing.   MONITOR:   Vent status, TF tolerance  ASSESSMENT:   Pt with PMH of asthma admitted with R thalamic hemorrhage with interventricular hemorrhage and 5 mm shift, noted positive ETOH/cocaine.  Pt discussed during ICU rounds and with RN.  11/19 extubated then became hypertensive with increased ICP from hydrocephalus and EVD placed and intubated. Off all sedation and per MD may need brain death testing.  No feeding access currently  Patient is currently intubated on ventilator support MV: 8 L/min Temp (24hrs), Avg:99.5 F (37.5 C), Min:97.9 F (36.6 C), Max:101.4 F (38.6 C)  Pt on no sedation Medications reviewed and include: colace, folic acid, lactulose, MVI, KCl, senokot, thimaine, hypertonic 3% Labs reviewed: K+ 2.8 (L)  Diet Order:  No diet orders on file  EDUCATION NEEDS:   No education needs have been identified at this time  Skin:  Skin Assessment: Reviewed RN Assessment  Last BM:  11/20  Height:   Ht Readings from Last 1 Encounters:  03-23-17 5\' 8"  (1.727 m)    Weight:   Wt Readings from Last 1 Encounters:  04/30/17 155 lb 10.3 oz (70.6 kg)    Ideal Body Weight:  70 kg  BMI:  Body mass index is 23.67 kg/m.  Estimated Nutritional Needs:   Kcal:  1850  Protein:  95-115 grams  Fluid:  >1.9 L/day  Kendell BaneHeather Kristien Salatino RD, LDN, CNSC [phone] Pager [phone] After Hours Pager

## 2017-04-30 NOTE — Progress Notes (Signed)
Neurosurgeon on call notified that IVC drain is no longer pulsating/working. Will continue to monitor patient.

## 2017-04-30 NOTE — Progress Notes (Signed)
PT Cancellation Note  Patient Details Name: Joseph Rubio MRN: [id number] DOB: Sep 09, 1965   Cancelled Treatment:    Reason Eval/Treat Not Completed: Medical issues which prohibited therapy(pt extubated, reintubated with neurologic change)   Ave Scharnhorst B Nasif Bos 04/30/2017, 7:04 AM  Delaney MeigsMaija Tabor Tashawn Laswell, PT [phone]

## 2017-04-30 NOTE — Progress Notes (Signed)
No change in exam overnight with ventricular drainage. Patient remains unconscious without evidence of eye opening to noxious stimuli. Pupils remained fixed and dilated. Corneal reflexes, oculocephalic reflexes, gag and cough reflexes all absent. No evidence of respiratory effort.  Ventriculostomy is now. Draining. I suspect this is secondary to lack of cerebral blood flow secondary to cerebral infarction. At this point I have nothing further to offer.

## 2017-04-30 NOTE — Clinical Social Work Note (Signed)
Clinical Social Worker continuing to follow patient and family for support.  CSW was asked by CDS to confirm patient spelling of his name with family, but no family at bedside.  Per RN, patient had apnea test completed today.  CSW to await family arrival to determine [facility] involvement.  CSW remains available for support as needed.  Macario GoldsJesse Arieh Bogue, [place] [phone]

## 2017-04-30 NOTE — Procedures (Signed)
Central Venous Catheter Insertion Procedure Note Joseph GrillsJeffery L Rubio [id number] 28-Aug-1965  Procedure: Insertion of Central Venous Catheter Indications: Assessment of intravascular volume, Drug and/or fluid administration and Frequent blood sampling  Procedure Details Consent: Unable to obtain consent because of emergent medical necessity. Time Out: Verified patient identification, verified procedure, site/side was marked, verified correct patient position, special equipment/implants available, medications/allergies/relevent history reviewed, required imaging and test results available.  Performed  Maximum sterile technique was used including antiseptics, cap, gloves, gown, hand hygiene, mask and sheet. Skin prep: Chlorhexidine; local anesthetic administered A antimicrobial bonded/coated triple lumen catheter was placed in the right internal jugular vein using the Seldinger technique.  Evaluation Blood flow good Complications: No apparent complications Patient did tolerate procedure well. Chest X-ray ordered to verify placement.  CXR: pending.  Joseph KussmaulKatalina Rubio, AGACNP-BC [facility]  Pgr: [phone]  PCCM Pgr: [phone]

## 2017-04-30 NOTE — Code Documentation (Signed)
Received call from Neuro MD on call for patient requiring emergent intubation at 0003 RRT called by NTICU staff at 0005 Code Blue Initiated for Airway Management at 0009 by RR RN  [facility] to [facility], RT was able to intubate patient, Code Blue cancelled by [employer] RN.   Start Time 0003 End Time 0030

## 2017-04-30 NOTE — Progress Notes (Signed)
SLP Cancellation Note  Patient Details Name: Joseph Rubio MRN: [id number] DOB: [birth year]   Cancelled treatment:       Reason Eval/Treat Not Completed: Medical issues which prohibited therapy; pt reintubated due to neurologic changes overnight.   Carmela RimaAmanda Shainna Faux, Student SLP 04/30/2017, 7:56 AM

## 2017-05-01 ENCOUNTER — Other Ambulatory Visit (HOSPITAL_COMMUNITY): Payer: Medicaid Other

## 2017-05-01 ENCOUNTER — Inpatient Hospital Stay (HOSPITAL_COMMUNITY): Payer: Medicaid Other | Admitting: Anesthesiology

## 2017-05-01 ENCOUNTER — Encounter (HOSPITAL_COMMUNITY): Admission: EM | Disposition: E | Payer: Self-pay | Source: Home / Self Care | Attending: Neurology

## 2017-05-01 ENCOUNTER — Encounter (HOSPITAL_COMMUNITY): Payer: Medicaid Other | Admitting: Anesthesiology

## 2017-05-01 DIAGNOSIS — G935 Compression of brain: Secondary | ICD-10-CM

## 2017-05-01 DIAGNOSIS — I361 Nonrheumatic tricuspid (valve) insufficiency: Secondary | ICD-10-CM

## 2017-05-01 DIAGNOSIS — G9382 Brain death: Secondary | ICD-10-CM

## 2017-05-01 HISTORY — PX: ORGAN PROCUREMENT: SHX5270

## 2017-05-01 LAB — BLOOD GAS, ARTERIAL
ACID-BASE DEFICIT: 1.2 mmol/L (ref 0.0–2.0)
Acid-base deficit: 1.8 mmol/L (ref 0.0–2.0)
Acid-base deficit: 2.4 mmol/L — ABNORMAL HIGH (ref 0.0–2.0)
BICARBONATE: 22.4 mmol/L (ref 20.0–28.0)
Bicarbonate: 23.2 mmol/L (ref 20.0–28.0)
Bicarbonate: 23 mmol/L (ref 20.0–28.0)
DRAWN BY: 44166
DRAWN BY: 24485
Drawn by: 331761
FIO2: 100
FIO2: 40
FIO2: 40
LHR: 14 {breaths}/min
MECHVT: 500 mL
MECHVT: 500 mL
O2 Saturation: 99.6 %
O2 Saturation: 99.5 %
O2 Saturation: 98.6 %
PEEP/CPAP: 5 cmH2O
PEEP/CPAP: 5 cmH2O
PEEP: 5 cmH2O
PH ART: 7.381 (ref 7.350–7.450)
PO2 ART: 375 mmHg — AB (ref 83.0–108.0)
Patient temperature: 99.1
Patient temperature: 98.6
Patient temperature: 98.6
RATE: 14 resp/min
RATE: 14 resp/min
VT: 500 mL
pCO2 arterial: 40.1 mmHg (ref 32.0–48.0)
pCO2 arterial: 42.3 mmHg (ref 32.0–48.0)
pCO2 arterial: 41.7 mmHg (ref 32.0–48.0)
pH, Arterial: 7.354 (ref 7.350–7.450)
pH, Arterial: 7.349 — ABNORMAL LOW (ref 7.350–7.450)
pO2, Arterial: 380 mmHg — ABNORMAL HIGH (ref 83.0–108.0)
pO2, Arterial: 132 mmHg — ABNORMAL HIGH (ref 83.0–108.0)

## 2017-05-01 LAB — CBC WITH DIFFERENTIAL/PLATELET
BASOS ABS: 0.1 10*3/uL (ref 0.0–0.1)
BASOS ABS: 0.1 10*3/uL (ref 0.0–0.1)
Basophils Relative: 1 %
Basophils Relative: 1 %
EOS ABS: 0.2 10*3/uL (ref 0.0–0.7)
EOS PCT: 2 %
Eosinophils Absolute: 0.2 10*3/uL (ref 0.0–0.7)
Eosinophils Relative: 2 %
HCT: 29.8 % — ABNORMAL LOW (ref 39.0–52.0)
HEMATOCRIT: 29.4 % — AB (ref 39.0–52.0)
HEMOGLOBIN: 9.3 g/dL — AB (ref 13.0–17.0)
HEMOGLOBIN: 9.1 g/dL — AB (ref 13.0–17.0)
LYMPHS PCT: 11 %
LYMPHS PCT: 12 %
Lymphs Abs: 1.2 10*3/uL (ref 0.7–4.0)
Lymphs Abs: 1.5 10*3/uL (ref 0.7–4.0)
MCH: 29.2 pg (ref 26.0–34.0)
MCH: 28.3 pg (ref 26.0–34.0)
MCHC: 31.6 g/dL (ref 30.0–36.0)
MCHC: 30.5 g/dL (ref 30.0–36.0)
MCV: 92.5 fL (ref 78.0–100.0)
MCV: 92.5 fL (ref 78.0–100.0)
MONOS PCT: 9 %
Monocytes Absolute: 1 10*3/uL (ref 0.1–1.0)
Monocytes Absolute: 0.9 10*3/uL (ref 0.1–1.0)
Monocytes Relative: 7 %
NEUTROS PCT: 78 %
Neutro Abs: 8.4 10*3/uL — ABNORMAL HIGH (ref 1.7–7.7)
Neutro Abs: 10 10*3/uL — ABNORMAL HIGH (ref 1.7–7.7)
Neutrophils Relative %: 77 %
PLATELETS: 351 10*3/uL (ref 150–400)
Platelets: 369 10*3/uL (ref 150–400)
RBC: 3.18 MIL/uL — AB (ref 4.22–5.81)
RBC: 3.22 MIL/uL — AB (ref 4.22–5.81)
RDW: 16 % — AB (ref 11.5–15.5)
RDW: 16.9 % — ABNORMAL HIGH (ref 11.5–15.5)
WBC: 10.9 10*3/uL — AB (ref 4.0–10.5)
WBC: 12.7 10*3/uL — AB (ref 4.0–10.5)

## 2017-05-01 LAB — COMPREHENSIVE METABOLIC PANEL
ALBUMIN: 2.4 g/dL — AB (ref 3.5–5.0)
ALK PHOS: 46 U/L (ref 38–126)
ALT: 27 U/L (ref 17–63)
ALT: 25 U/L (ref 17–63)
AST: 27 U/L (ref 15–41)
AST: 25 U/L (ref 15–41)
Albumin: 2.3 g/dL — ABNORMAL LOW (ref 3.5–5.0)
Alkaline Phosphatase: 46 U/L (ref 38–126)
BILIRUBIN TOTAL: 0.8 mg/dL (ref 0.3–1.2)
BILIRUBIN TOTAL: 0.6 mg/dL (ref 0.3–1.2)
BUN: 27 mg/dL — AB (ref 6–20)
BUN: 22 mg/dL — AB (ref 6–20)
CALCIUM: 8.2 mg/dL — AB (ref 8.9–10.3)
CALCIUM: 8.3 mg/dL — AB (ref 8.9–10.3)
CO2: 23 mmol/L (ref 22–32)
CO2: 22 mmol/L (ref 22–32)
CREATININE: 1.18 mg/dL (ref 0.61–1.24)
Chloride: >130 mmol/L (ref 101–111)
Chloride: >130 mmol/L (ref 101–111)
Creatinine, Ser: 1.19 mg/dL (ref 0.61–1.24)
GFR calc non Af Amer: >60 mL/min (ref >60)
GLUCOSE: 82 mg/dL (ref 65–99)
Glucose, Bld: 126 mg/dL — ABNORMAL HIGH (ref 65–99)
POTASSIUM: 3.6 mmol/L (ref 3.5–5.1)
Potassium: 3.4 mmol/L — ABNORMAL LOW (ref 3.5–5.1)
SODIUM: 161 mmol/L — AB (ref 135–145)
Sodium: 161 mmol/L (ref 135–145)
TOTAL PROTEIN: 6.1 g/dL — AB (ref 6.5–8.1)
Total Protein: 5.8 g/dL — ABNORMAL LOW (ref 6.5–8.1)

## 2017-05-01 LAB — URINALYSIS, ROUTINE W REFLEX MICROSCOPIC
Bilirubin Urine: NEGATIVE
Glucose, UA: NEGATIVE mg/dL
Hgb urine dipstick: NEGATIVE
KETONES UR: NEGATIVE mg/dL
LEUKOCYTES UA: NEGATIVE
NITRITE: NEGATIVE
PH: 5 (ref 5.0–8.0)
Protein, ur: NEGATIVE mg/dL
SPECIFIC GRAVITY, URINE: 1.002 — AB (ref 1.005–1.030)

## 2017-05-01 LAB — CK TOTAL AND CKMB (NOT AT ARMC)
CK, MB: 9.5 ng/mL — AB (ref 0.5–5.0)
CK, MB: 6.1 ng/mL — AB (ref 0.5–5.0)
RELATIVE INDEX: 2 (ref 0.0–2.5)
RELATIVE INDEX: 1.4 (ref 0.0–2.5)
Total CK: 472 U/L — ABNORMAL HIGH (ref 49–397)
Total CK: 421 U/L — ABNORMAL HIGH (ref 49–397)

## 2017-05-01 LAB — LIPASE, BLOOD
Lipase: 24 U/L (ref 11–51)
Lipase: 26 U/L (ref 11–51)

## 2017-05-01 LAB — PROTIME-INR
INR: 1.27
PROTHROMBIN TIME: 15.8 s — AB (ref 11.4–15.2)

## 2017-05-01 LAB — GLUCOSE, CAPILLARY
GLUCOSE-CAPILLARY: 100 mg/dL — AB (ref 65–99)
GLUCOSE-CAPILLARY: 145 mg/dL — AB (ref 65–99)
Glucose-Capillary: 121 mg/dL — ABNORMAL HIGH (ref 65–99)

## 2017-05-01 LAB — APTT: APTT: 32 s (ref 24–36)

## 2017-05-01 LAB — PHOSPHORUS: Phosphorus: 4.5 mg/dL (ref 2.5–4.6)

## 2017-05-01 LAB — AMYLASE
Amylase: 48 U/L (ref 28–100)
Amylase: 49 U/L (ref 28–100)

## 2017-05-01 LAB — ECHOCARDIOGRAM COMPLETE
Height: 68 in
WEIGHTICAEL: 2490.32 [oz_av]

## 2017-05-01 LAB — TROPONIN I: TROPONIN I: 0.12 ng/mL — AB (ref <0.03)

## 2017-05-01 LAB — MAGNESIUM: MAGNESIUM: 2.9 mg/dL — AB (ref 1.7–2.4)

## 2017-05-01 SURGERY — SURGICAL PROCUREMENT, ORGAN
Anesthesia: General | Site: Abdomen

## 2017-05-01 MED ORDER — SODIUM CHLORIDE 0.9 % IV SOLN
1000.0000 mg | INTRAVENOUS | Status: AC
  Administered 2017-05-01: 1000 mg via INTRAVENOUS
  Filled 2017-05-01 (×2): qty 8

## 2017-05-01 MED ORDER — HEPARIN SODIUM (PORCINE) 5000 UNIT/ML IJ SOLN
21180.0000 [IU] | Freq: Once | INTRAMUSCULAR | Status: DC
  Filled 2017-05-01: qty 4.24

## 2017-05-01 MED ORDER — PIPERACILLIN-TAZOBACTAM 3.375 G IVPB 30 MIN
3.3750 g | INTRAVENOUS | Status: AC
  Administered 2017-05-01: 3.375 g via INTRAVENOUS
  Filled 2017-05-01 (×2): qty 50

## 2017-05-01 MED ORDER — DEXTROSE 5 % IV BOLUS
500.0000 mL | Freq: Once | INTRAVENOUS | Status: AC
  Administered 2017-05-01: 500 mL via INTRAVENOUS

## 2017-05-01 MED ORDER — FREE WATER
150.0000 mL | Status: DC
  Administered 2017-05-01 (×15): 150 mL

## 2017-05-01 MED ORDER — VECURONIUM BROMIDE 10 MG IV SOLR
10.0000 mg | INTRAVENOUS | Status: AC
  Filled 2017-05-01: qty 10

## 2017-05-01 MED ORDER — FENTANYL CITRATE (PF) 250 MCG/5ML IJ SOLN
INTRAMUSCULAR | Status: AC
  Filled 2017-05-01: qty 5

## 2017-05-01 MED ORDER — DEXTROSE-NACL 5-0.9 % IV SOLN
INTRAVENOUS | Status: DC | PRN
  Administered 2017-05-01: 21:00:00 via INTRAVENOUS

## 2017-05-01 MED ORDER — KCL IN DEXTROSE-NACL 40-5-0.45 MEQ/L-%-% IV SOLN
INTRAVENOUS | Status: AC
  Administered 2017-05-01 (×2): via INTRAVENOUS
  Filled 2017-05-01 (×2): qty 1000

## 2017-05-01 MED ORDER — PHENYLEPHRINE HCL 10 MG/ML IJ SOLN
INTRAVENOUS | Status: DC | PRN
  Administered 2017-05-01: 10 ug/min via INTRAVENOUS

## 2017-05-01 MED ORDER — HEPARIN SODIUM (PORCINE) 1000 UNIT/ML IJ SOLN
INTRAMUSCULAR | Status: DC | PRN
  Administered 2017-05-01: 21000 [IU] via INTRAVENOUS

## 2017-05-01 MED ORDER — ROCURONIUM BROMIDE 100 MG/10ML IV SOLN
INTRAVENOUS | Status: DC | PRN
  Administered 2017-05-01: 50 mg via INTRAVENOUS

## 2017-05-01 MED ORDER — CHLORHEXIDINE GLUCONATE CLOTH 2 % EX PADS
6.0000 | MEDICATED_PAD | Freq: Every day | CUTANEOUS | Status: DC
  Administered 2017-05-01: 6 via TOPICAL

## 2017-05-01 MED ORDER — SODIUM CHLORIDE 0.9 % IV SOLN
1000.0000 mg | INTRAVENOUS | Status: AC
  Administered 2017-05-01: 1000 mg via INTRAVENOUS
  Filled 2017-05-01 (×2): qty 1000

## 2017-05-01 MED ORDER — HEPARIN SODIUM (PORCINE) 1000 UNIT/ML IJ SOLN
21180.0000 [IU] | Freq: Once | INTRAMUSCULAR | Status: DC
Start: 2017-05-01 — End: 2017-05-01
  Filled 2017-05-01 (×2): qty 21.2

## 2017-05-01 MED ORDER — ALBUMIN HUMAN 5 % IV SOLN
INTRAVENOUS | Status: DC | PRN
  Administered 2017-05-01: 22:00:00 via INTRAVENOUS

## 2017-05-01 MED ORDER — SODIUM CHLORIDE 0.9% FLUSH: 10.0000 mL | INTRAVENOUS | Status: DC | PRN

## 2017-05-01 MED ORDER — DESMOPRESSIN ACETATE 4 MCG/ML IJ SOLN
0.5000 ug | Freq: Once | INTRAMUSCULAR | Status: AC
  Administered 2017-05-01: 0.52 ug via INTRAVENOUS
  Filled 2017-05-01: qty 1

## 2017-05-01 MED ORDER — DOPAMINE-DEXTROSE 3.2-5 MG/ML-% IV SOLN
2.0000 ug/kg/min | INTRAVENOUS | Status: DC
  Filled 2017-05-01: qty 250

## 2017-05-01 MED ORDER — DEXTROSE 5 % IV BOLUS
1000.0000 mL | Freq: Once | INTRAVENOUS | Status: AC
  Administered 2017-05-01: 1000 mL via INTRAVENOUS

## 2017-05-01 MED ORDER — ALBUMIN HUMAN 5 % IV SOLN
25.0000 g | Freq: Once | INTRAVENOUS | Status: AC
  Administered 2017-05-01: 25 g via INTRAVENOUS
  Filled 2017-05-01: qty 500

## 2017-05-01 SURGICAL SUPPLY — 77 items
BLADE 10 SAFETY STRL DISP (BLADE) ×3 IMPLANT
BLADE CLIPPER SURG (BLADE) IMPLANT
BLADE STERNUM SYSTEM 6 (BLADE) ×2 IMPLANT
BLADE SURG 10 STRL SS (BLADE) ×6 IMPLANT
CANNULA VESSEL 3MM 2 BLNT TIP (CANNULA) ×2 IMPLANT
CANNULA VESSEL W/WING WO/VALVE (CANNULA) IMPLANT
CLIP VESOCCLUDE MED 24/CT (CLIP) ×3 IMPLANT
CLIP VESOCCLUDE SM WIDE 24/CT (CLIP) ×3 IMPLANT
CONT SPEC 4OZ CLIKSEAL STRL BL (MISCELLANEOUS) ×12 IMPLANT
COVER BACK TABLE 60X90IN (DRAPES) IMPLANT
COVER MAYO STAND STRL (DRAPES) ×3 IMPLANT
COVER SURGICAL LIGHT HANDLE (MISCELLANEOUS) ×3 IMPLANT
DRAPE HALF SHEET 40X57 (DRAPES) IMPLANT
DRAPE SLUSH MACHINE 52X66 (DRAPES) ×3 IMPLANT
DRSG COVADERM 4X10 (GAUZE/BANDAGES/DRESSINGS) ×8 IMPLANT
DRSG TELFA 3X8 NADH (GAUZE/BANDAGES/DRESSINGS) ×3 IMPLANT
DURAPREP 26ML APPLICATOR (WOUND CARE) IMPLANT
ELECT BLADE 6.5 EXT (BLADE) IMPLANT
ELECT REM PT RETURN 9FT ADLT (ELECTROSURGICAL) ×6
ELECTRODE REM PT RTRN 9FT ADLT (ELECTROSURGICAL) ×2 IMPLANT
GAUZE SPONGE 4X4 16PLY XRAY LF (GAUZE/BANDAGES/DRESSINGS) IMPLANT
GLOVE BIO SURGEON STRL SZ 6.5 (GLOVE) ×1 IMPLANT
GLOVE BIO SURGEON STRL SZ7 (GLOVE) ×4 IMPLANT
GLOVE BIO SURGEONS STRL SZ 6.5 (GLOVE) ×1
GLOVE BIOGEL PI IND STRL 6.5 (GLOVE) IMPLANT
GLOVE BIOGEL PI INDICATOR 6.5 (GLOVE) ×4
GLOVE BIOGEL PI ORTHO PRO SZ7 (GLOVE) ×4
GLOVE INDICATOR 7.0 STRL GRN (GLOVE) ×4 IMPLANT
GLOVE INDICATOR 7.5 STRL GRN (GLOVE) ×2 IMPLANT
GLOVE PI ORTHO PRO STRL SZ7 (GLOVE) IMPLANT
GOWN STRL REUS W/ TWL LRG LVL3 (GOWN DISPOSABLE) ×4 IMPLANT
GOWN STRL REUS W/TWL LRG LVL3 (GOWN DISPOSABLE) ×12
KIT POST MORTEM ADULT 36X90 (BAG) ×3 IMPLANT
KIT ROOM TURNOVER OR (KITS) ×3 IMPLANT
LOOP VESSEL MAXI BLUE (MISCELLANEOUS) ×3 IMPLANT
LOOP VESSEL MINI RED (MISCELLANEOUS) ×3 IMPLANT
MANIFOLD NEPTUNE II (INSTRUMENTS) IMPLANT
NDL BIOPSY 14X6 SOFT TISS (NEEDLE) ×1 IMPLANT
NEEDLE BIOPSY 14X6 SOFT TISS (NEEDLE) ×3 IMPLANT
NS IRRIG 1000ML POUR BTL (IV SOLUTION) IMPLANT
PACK AORTA (CUSTOM PROCEDURE TRAY) ×3 IMPLANT
PAD ARMBOARD 7.5X6 YLW CONV (MISCELLANEOUS) ×6 IMPLANT
PAD DRESSING TELFA 3X8 NADH (GAUZE/BANDAGES/DRESSINGS) ×1 IMPLANT
PENCIL BUTTON HOLSTER BLD 10FT (ELECTRODE) ×3 IMPLANT
SOL PREP POV-IOD 4OZ 10% (MISCELLANEOUS) ×3 IMPLANT
SPONGE INTESTINAL PEANUT (DISPOSABLE) ×6 IMPLANT
SPONGE LAP 18X18 X RAY DECT (DISPOSABLE) IMPLANT
STAPLER VISISTAT 35W (STAPLE) ×5 IMPLANT
SUCTION POOLE TIP (SUCTIONS) ×6 IMPLANT
SUT BONE WAX W31G (SUTURE) ×5 IMPLANT
SUT ETHIBOND 5 LR DA (SUTURE) ×8 IMPLANT
SUT ETHILON 1 LR 30 (SUTURE) ×9 IMPLANT
SUT ETHILON 2 LR (SUTURE) IMPLANT
SUT PDS AB 4-0 SH 27 (SUTURE) ×4 IMPLANT
SUT PROLENE 6 0 BV (SUTURE) ×2 IMPLANT
SUT SILK 0 TIES 10X30 (SUTURE) ×3 IMPLANT
SUT SILK 1 SH (SUTURE) IMPLANT
SUT SILK 1 TIES 10X30 (SUTURE) IMPLANT
SUT SILK 2 0 (SUTURE)
SUT SILK 2 0 SH (SUTURE) ×4 IMPLANT
SUT SILK 2 0 SH CR/8 (SUTURE) IMPLANT
SUT SILK 2 0 TIES 10X30 (SUTURE) ×9 IMPLANT
SUT SILK 2-0 18XBRD TIE 12 (SUTURE) IMPLANT
SUT SILK 3 0 TIES 10X30 (SUTURE) IMPLANT
SUT VIC AB 2-0 CT1 27 (SUTURE) ×12
SUT VIC AB 2-0 CT1 TAPERPNT 27 (SUTURE) IMPLANT
SWAB COLLECTION DEVICE MRSA (MISCELLANEOUS) IMPLANT
SWAB CULTURE ESWAB REG 1ML (MISCELLANEOUS) IMPLANT
SYR 20CC LL (SYRINGE) ×2 IMPLANT
SYRINGE TOOMEY DISP (SYRINGE) ×3 IMPLANT
TAPE UMBILICAL 1/8 X36 TWILL (MISCELLANEOUS) ×6 IMPLANT
TOWEL OR 17X24 6PK STRL BLUE (TOWEL DISPOSABLE) ×3 IMPLANT
TOWEL OR 17X26 10 PK STRL BLUE (TOWEL DISPOSABLE) ×6 IMPLANT
TUBE CONNECTING 12'X1/4 (SUCTIONS) ×1
TUBE CONNECTING 12X1/4 (SUCTIONS) ×2 IMPLANT
WATER STERILE IRR 1000ML POUR (IV SOLUTION) IMPLANT
YANKAUER SUCT BULB TIP NO VENT (SUCTIONS) ×3 IMPLANT

## 2017-05-01 NOTE — Progress Notes (Signed)
  Echocardiogram 2D Echocardiogram has been performed.  Joseph Rubio [name] May 01, 2017, 8:27 AM

## 2017-05-01 NOTE — Procedures (Signed)
Bronchoscopy Procedure Note DONAVEN CRISWELL [id number] 1965/11/06  Procedure: Bronchoscopy Indications: donot evaluation  Procedure Details Consent: Risks of procedure as well as the alternatives and risks of each were explained to the (patient/caregiver).  Consent for procedure obtained. Time Out: Verified patient identification, verified procedure, site/side was marked, verified correct patient position, special equipment/implants available, medications/allergies/relevent history reviewed, required imaging and test results available.  Performed  In preparation for procedure, bronchoscope lubricated. Sedation: none  Airway entered and the following bronchi were examined: RUL, RML, RLL, LUL, LLL and Bronchi.   Procedures performed: Brushings performed - no Bronchoscope removed.  , Patient placed back on 100% FiO2 at conclusion of procedure.    Evaluation Hemodynamic Status: BP stable throughout; O2 sats: stable throughout Patient's Current Condition: stable Specimens:  Sent purulent fluid Complications: No apparent complications Patient did tolerate procedure well.   Raylene Miyamoto. 05/09/2017   1. Normal anatomy 2. Mild tan secretions LLL, easly suctioned clear 3. BAL asked and performed, 10 ml only , back 8 ml saline  - tan thin 4. No lesions  Lavon Paganini. Titus Mould, MD, [place] Pgr: [facility]

## 2017-05-01 NOTE — Transfer of Care (Signed)
Organ procurement 

## 2017-05-01 NOTE — Progress Notes (Signed)
[facility] Lung Recruitment Maneuver [facility] [place], RN request. Pt tolerated it fairly well noted a slight drop in arterial blood pressure from the 110-14215mmhg systolic to the upper 80's-90's systolic. Recruitment done on PEEP of 25 per Jennette [place], RN request and potential donor orders. Patient return back to previous setting and arterial blood pressure came back to baseline. No further complications noted. RRT at bedside throughout.

## 2017-05-01 NOTE — OR Nursing (Signed)
0.9% Sodium chloride irrigation used during the procedure by surgeons (Dr. Jefm MilesAlan Farney, Dr. Daisy FloroJennifer Miller-Ocuin, Dr. Percell BeltVenkateswara Gurram). Sodium chloride x 8 bottles (1000ml), exp Aug 21 on the irrigation.

## 2017-05-01 NOTE — Anesthesia Preprocedure Evaluation (Signed)
Anesthesia Evaluation  Patient identified by MRN, date of birth, ID band Patient unresponsive    Reviewed: Unable to perform ROS - Chart review only  Airway Mallampati: Intubated       Dental   Pulmonary Current Smoker,     + decreased breath sounds      Cardiovascular  Rhythm:Regular Rate:Normal     Neuro/Psych    GI/Hepatic   Endo/Other    Renal/GU      Musculoskeletal   Abdominal   Peds  Hematology   Anesthesia Other Findings   Reproductive/Obstetrics                             Anesthesia Physical Anesthesia Plan  ASA: VI  Anesthesia Plan: General   Post-op Pain Management:    Induction: Inhalational  PONV Risk Score and Plan:   Airway Management Planned: Oral ETT  Additional Equipment:   Intra-op Plan:   Post-operative Plan:   Informed Consent: I have reviewed the patients History and Physical, chart, labs and discussed the procedure including the risks, benefits and alternatives for the proposed anesthesia with the patient or authorized representative who has indicated his/her understanding and acceptance.     Plan Discussed with:   Anesthesia Plan Comments:         Anesthesia Quick Evaluation

## 2017-05-01 NOTE — Plan of Care (Signed)
Pt declared brain dead, active organ donor.

## 2017-05-02 LAB — TYPE AND SCREEN
ABO/RH(D): B POS
ANTIBODY SCREEN: NEGATIVE
UNIT DIVISION: 0
Unit division: 0
Unit division: 0
Unit division: 0
Unit division: 0
Unit division: 0

## 2017-05-02 LAB — BPAM RBC
BLOOD PRODUCT EXPIRATION DATE: 201812012359
BLOOD PRODUCT EXPIRATION DATE: 201812042359
BLOOD PRODUCT EXPIRATION DATE: 201812102359
Blood Product Expiration Date: 201812042359
Blood Product Expiration Date: 201812172359
Blood Product Expiration Date: 201812172359
ISSUE DATE / TIME: 201811202317
ISSUE DATE / TIME: 201811210136
UNIT TYPE AND RH: 7300
UNIT TYPE AND RH: 7300
UNIT TYPE AND RH: 7300
Unit Type and Rh: 7300
Unit Type and Rh: 7300
Unit Type and Rh: 7300

## 2017-05-02 LAB — GLUCOSE, CAPILLARY: Glucose-Capillary: 81 mg/dL (ref 65–99)

## 2017-05-02 LAB — URINE CULTURE: Culture: NO GROWTH

## 2017-05-02 NOTE — Anesthesia Postprocedure Evaluation (Signed)
Anesthesia Post Note  Patient: Joseph Rubio  Procedure(s) Performed: ORGAN PROCUREMENT LIVER AND KIDNEY (N/A Abdomen)     Patient location during evaluation: Other Anesthesia Type: General Comments: Deceased. Organ procurement. Monitors removed. Transferred to morgue.                 Shelton SilvasKevin D Jaimeson Gopal

## 2017-05-03 ENCOUNTER — Encounter (HOSPITAL_COMMUNITY): Payer: Self-pay

## 2017-05-03 LAB — CULTURE, RESPIRATORY

## 2017-05-03 LAB — CULTURE, BAL-QUANTITATIVE

## 2017-05-03 LAB — CULTURE, RESPIRATORY W GRAM STAIN: Culture: NORMAL

## 2017-05-05 LAB — CULTURE, BLOOD (ROUTINE X 2): Culture: NO GROWTH

## 2017-05-06 LAB — CULTURE, BLOOD (ROUTINE X 2)
CULTURE: NO GROWTH
Special Requests: ADEQUATE

## 2017-05-07 ENCOUNTER — Encounter (HOSPITAL_COMMUNITY): Payer: Self-pay | Admitting: Certified Registered Nurse Anesthetist

## 2017-05-11 NOTE — Death Summary Note (Signed)
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   LABORATORY STUDIES CBC    Component Value Date/Time   WBC 10.9 (H) May 28, 2017 0341   RBC 3.18 (L) May 28, 2017 0341   HGB 9.3 (L) May 28, 2017 0341   HCT 29.4 (L) 05-28-17 0341   PLT 369 05-28-2017 0341   MCV 92.5 2017-05-28 0341   MCH 29.2 May 28, 2017 0341   MCHC 31.6 May 28, 2017 0341   RDW 16.0 (H) 2017/05/28 0341   LYMPHSABS 1.2 May 28, 2017 0341   MONOABS 1.0 May 28, 2017 0341   EOSABS 0.2 05/28/17 0341   BASOSABS 0.1 28-May-2017 0341   CMP    Component Value Date/Time   NA 161 (HH) 2017/05/28 0341   K 3.6 05/28/2017 0341   CL >130 (HH) 05-28-2017 0341   CO2 23 May 28, 2017 0341   GLUCOSE 82 May 28, 2017 0341   BUN 27 (H) 28-May-2017 0341   CREATININE 1.19 May 28, 2017 0341   CALCIUM 8.2 (L) May 28, 2017 0341   PROT 6.1 (L) May 28, 2017 0341   ALBUMIN 2.4 (L) May 28, 2017 0341   AST 27 05-28-17 0341   ALT 27 May 28, 2017 0341   ALKPHOS 46 05-28-2017 0341   BILITOT 0.8 05/28/2017 0341   GFRNONAA >60 05-28-17 0341   GFRAA >60 05-28-2017 0341   COAGS Lab Results  Component Value Date   INR 1.17 04/30/2017   INR 1.23 05/04/2017   Lipid Panel    Component  Value Date/Time   CHOL 125 04/27/2017 0158   TRIG 122 04/27/2017 0158   HDL 43 04/27/2017 0158   CHOLHDL 2.9 04/27/2017 0158   VLDL 24 04/27/2017 0158   LDLCALC 58 04/27/2017 0158   HgbA1C  Lab Results  Component Value Date   HGBA1C 4.9 04/13/2017   Urinalysis    Component Value Date/Time   COLORURINE YELLOW 28-May-2017 0634   APPEARANCEUR CLEAR 28-May-2017 0634   LABSPEC 1.002 (L) 05/28/17 0634   PHURINE 5.0 May 28, 2017 0634   GLUCOSEU NEGATIVE 28-May-2017 0634   HGBUR NEGATIVE 2017-05-28 0634   BILIRUBINUR NEGATIVE 2017/05/28 0634   KETONESUR NEGATIVE 05-28-2017 0634   PROTEINUR NEGATIVE May 28, 2017 0634   NITRITE NEGATIVE May 28, 2017 0634   LEUKOCYTESUR NEGATIVE 05-28-17 0634   Urine Drug Screen     Component Value Date/Time   LABOPIA NONE DETECTED 05/08/2017 0615   COCAINSCRNUR POSITIVE (A) 04/11/2017 0615   LABBENZ NONE DETECTED 05/05/2017 0615   AMPHETMU NONE DETECTED 05/02/2017 0615   THCU POSITIVE (A) 05/10/2017 0615   LABBARB NONE DETECTED 05/02/2017 0615    Alcohol Level    Component Value Date/Time   ETH 40 (H) 05/10/2017 0133     SIGNIFICANT DIAGNOSTIC STUDIES Ct Angio Head W Or Wo Contrast Ct Angio Neck  W Or Wo Contrast 04/27/2017 IMPRESSION:  1. No significant interval change in size of right thalamic intraparenchymal hematoma, estimated volume 25 cc on today's exam. Similar localized mass effect with 4 mm right-to-left shift. Intraventricular extension with stable ventricular dilatation and configuration.  2. Negative CTA of the head and neck. No vascular abnormality seen underlying the intraparenchymal hematoma.  3. Moderate carotid siphon atherosclerosis without high-grade stenosis. No other high-grade or correctable stenosis within the major arterial vasculature of the head and neck.  4. Layering bilateral pleural effusions.   CT Angio Brain  04/24/2017  1. Overall little interval change in size and appearance of acute intraparenchymal  hematoma centered at the left thalamus with intraventricular extension and mild localized midline shift. No significant interval change in size and configuration of the ventricles. 2. Negative CTA with no vascular abnormality underlying the intraparenchymal hematoma identified. 3. Moderate carotid siphon atherosclerosis without high-grade flow-limiting stenosis. No hemodynamically significant or correctable stenosis within the intracranial circulation  TTE - Severe LVH including apical segments. Grade 2 diastolic dysfunction with elevated filling pressures. Consider cardiac MRI to evaluate for hypertrophic cardiomyopathy.  Ct Head Wo Contrast 04/30/2017 IMPRESSION: 1. Interval development of diffuse cerebral edema with elevated intracranial pressure and central downward herniation. Foramen magnum stenosis. 2. New left frontal ventriculostomy with decompressed ventricles. 3. Stable right thalamic and intraventricular hemorrhage.    04/30/2017 IMPRESSION: 1. Interval decrease in size of right thalamic intraparenchymal hematoma, estimated volume 20 CC on today's examination, previously 25 cc. Slightly decreased regional mass effect with similar 4 mm of right-to-left shift. Intraventricular extension with stable ventricular dilatation and configuration. 2. No other new acute intracranial abnormality     HISTORY OF PRESENT ILLNESS Joseph Rubio is an 51 y.o. male who was LKN at 2300 on Sunday night per EMS. He was left at home to babysit a child. When family returned, he was found down on the floor of the bathroom naked near the shower. Family suspected he had collapsed after getting out of the shower. He was found at 12:07 AM. He vomited at home prior to EMS arrival. En route they noted forced gaze deviation to the right with left sided weakness. On arrival to the ED he was able to speak to staff. During CT patient became altered. CT head showed a large posterior basal ganglia and thalamus  hemorrhage on the right with intraventricular extension and mass effect upon the right half of the midbrain. He was able to state his PMHx to EMS en route as consisting of asthma only. He denied being on any medications.    HOSPITAL COURSE Joseph Rubio is a 51 y.o. 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   CTA head and neck negative  2D Echo EF 65-70%  LDL 58  HgbA1c - 4.9  UDS + for cocaine and THC  Cerebral edema  3% saline restarted  Na 149->147->143->138-> 154 this am  CT diffuse edema with herniation likely associated with brain death  Respiratory failure   Re-intubated overnight  CCM on board  Plan for extubate tomorrow after family visit  Hypotension  BP trending low  Continue monitoring  Cocaine and marijuana abuse  UDS positive for cocaine and [facility]  Medical examiner informed  Anemia, unclear source  Hb 120->9.0->8.5->7.8->7.8->7.9->7.4->7.9  Alcohol abuse  Was on B1/FA/MVI   Other Active Problems  Hypokalemia - 2.9 -> 3.0 ->3.2->2.8   DISCHARGE EXAM Pt declared brain death on 04/30/17 at 1216.   Marvel PlanJindong Aristeo Hankerson, MD PhD Stroke Neurology 12-07-2016 11:01 AM

## 2017-05-11 DEATH — deceased

## 2018-07-15 IMAGING — CT CT HEAD W/O CM
4 series · 16 of 47 positions shown, 18 images · non-contrast
Comparison: Earlier today

CLINICAL DATA: Followup drain placement.

EXAM:
CT HEAD WITHOUT CONTRAST
TECHNIQUE: Contiguous axial images were obtained from the base of the skull
through the vertex without intravenous contrast.

[Series 3: head without · axial · non-contrast · 0.46mm/px · z∈[-98,+32]mm · 7 of 36 slices shown, 9 images]
[im 5/36  brain]
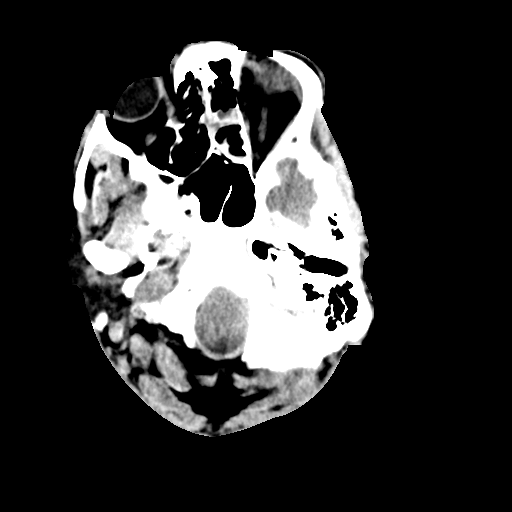
[im 5/36  bone]
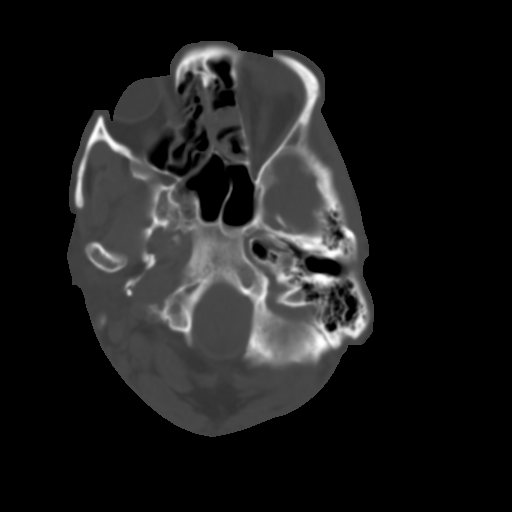
[im 9/36  brain]
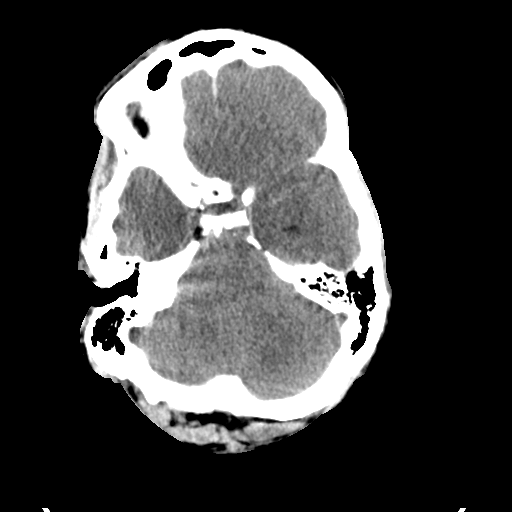
[im 14/36  brain]
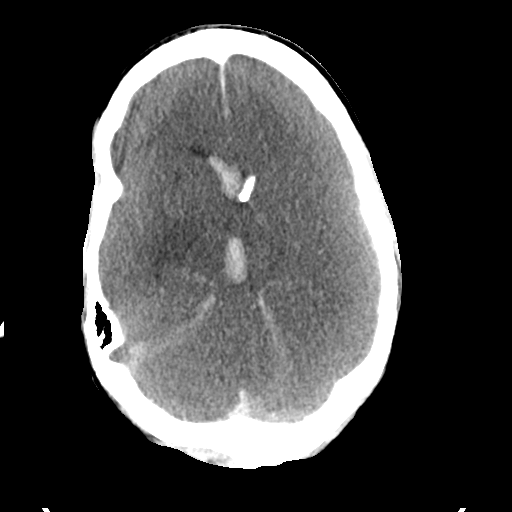
[im 18/36  brain]
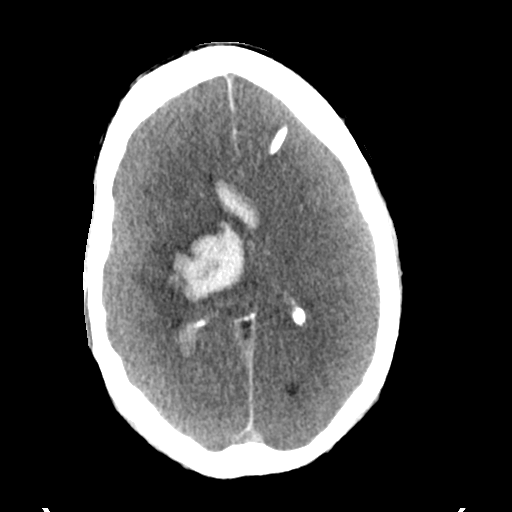
[im 22/36  brain]
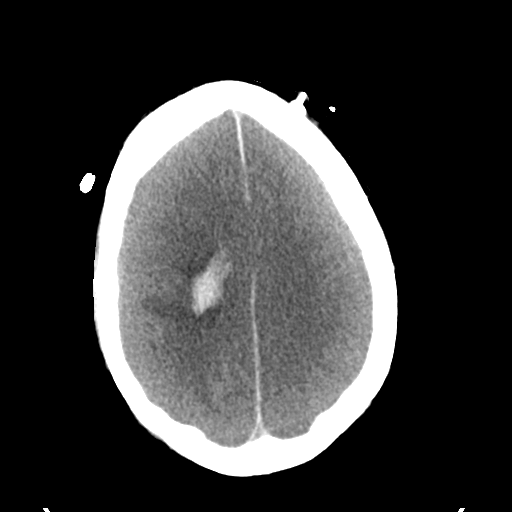
[im 22/36  bone]
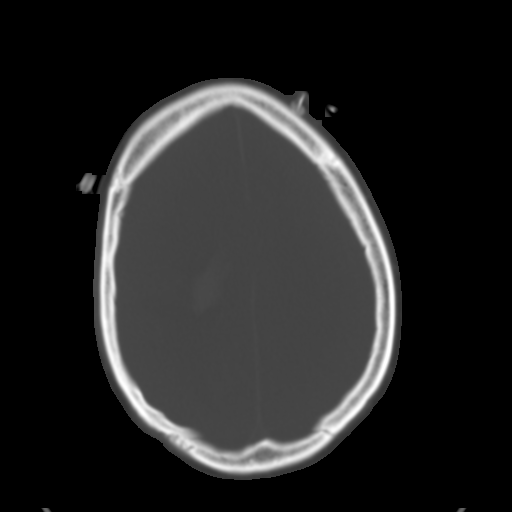
[im 27/36  brain]
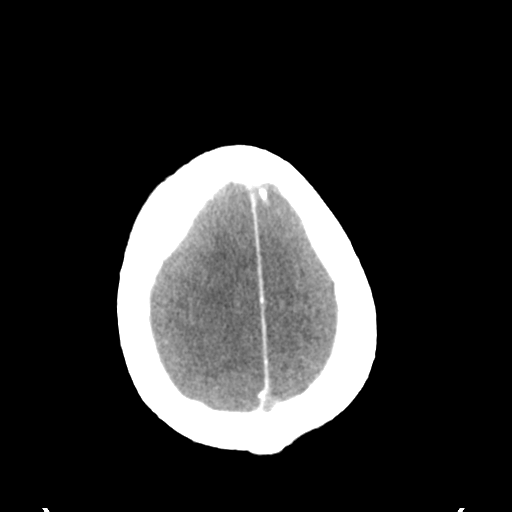
[im 31/36  brain]
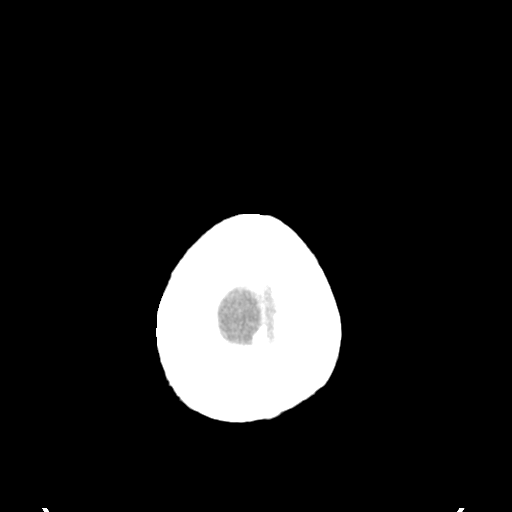

[Series 4: head bone · axial · 0.46mm/px · z∈[-102,-66]mm · 3 of 90 slices shown]
[im 9/90  bone]
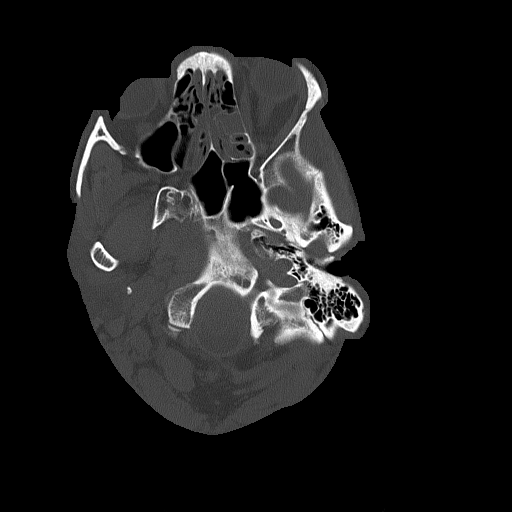
[im 18/90  bone]
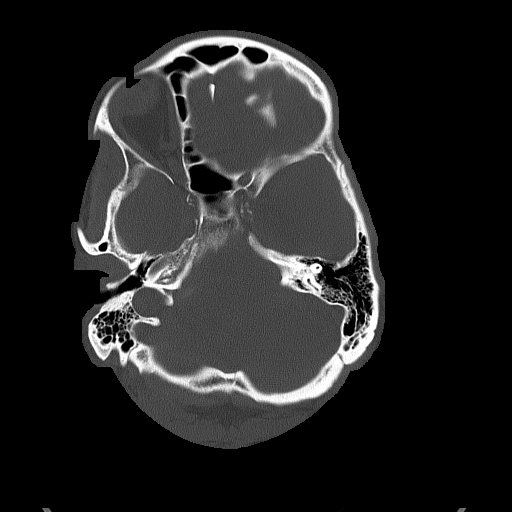
[im 27/90  bone]
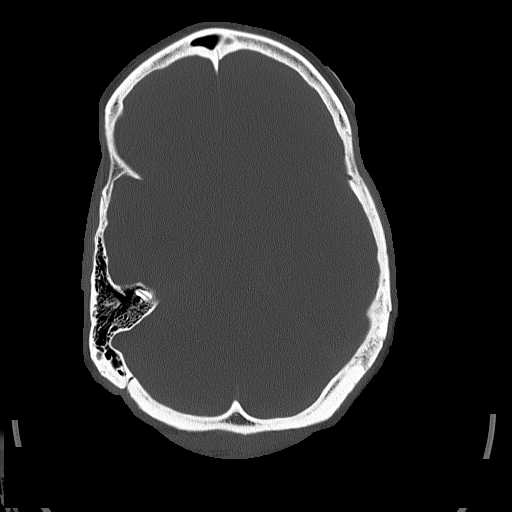

[Series 5: head without cor · coronal · non-contrast · 0.35mm/px · 3 of 75 slices shown]
[im 25/75  brain]
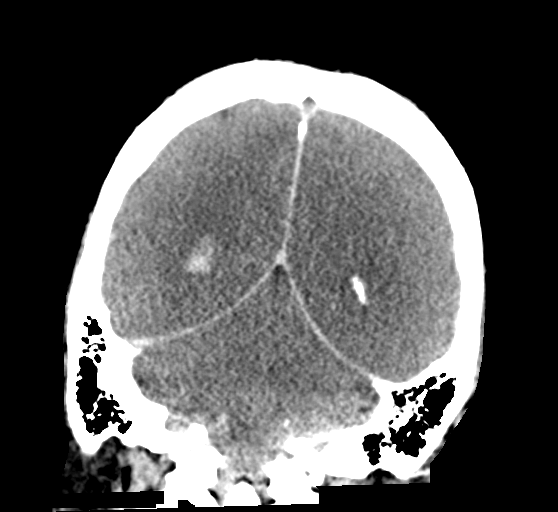
[im 33/75  brain]
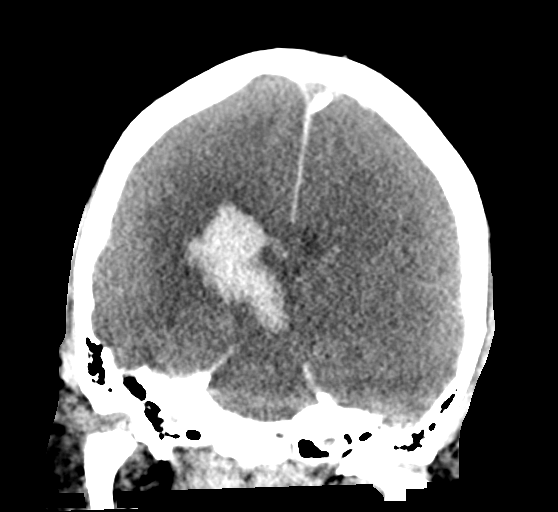
[im 42/75  brain]
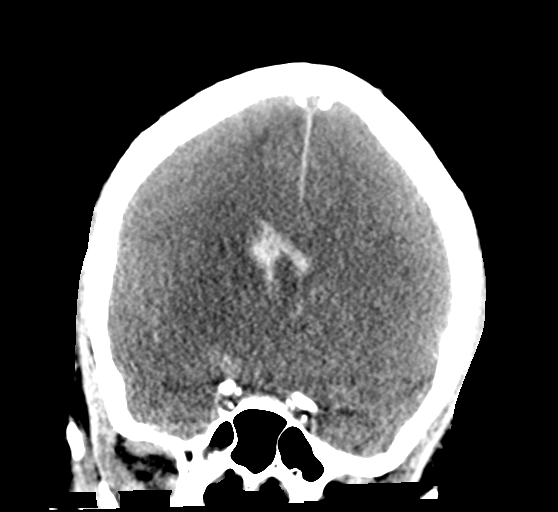

[Series 6: head without sag · sagittal · non-contrast · 0.35mm/px · 3 of 62 slices shown]
[im 21/62  brain]
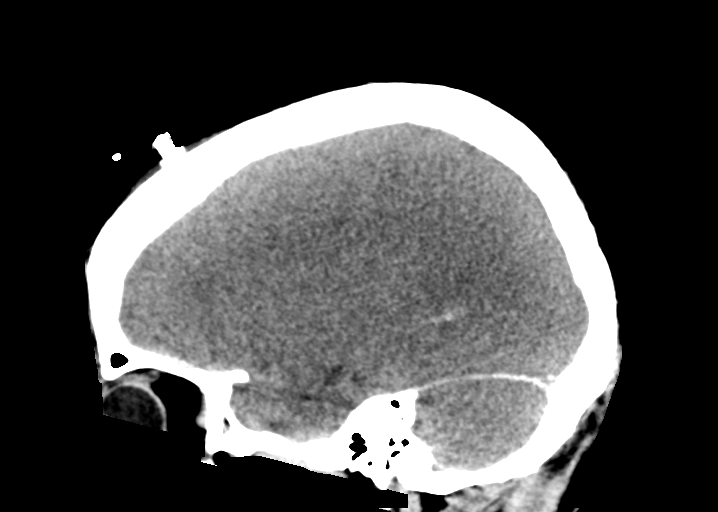
[im 31/62  brain]
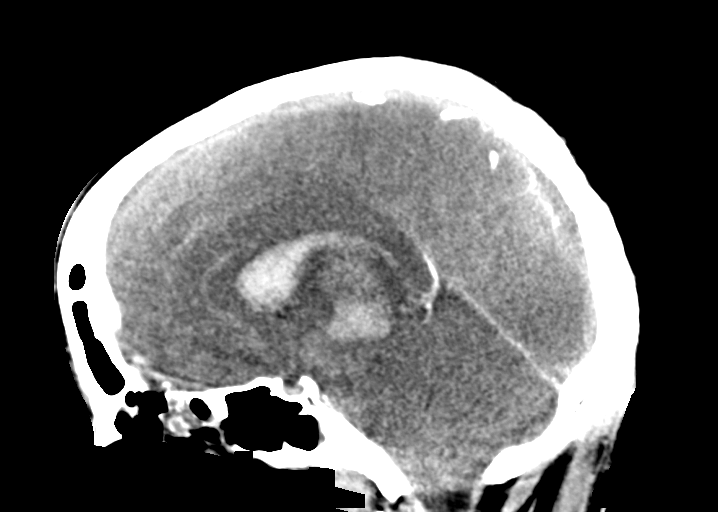
[im 41/62  brain]
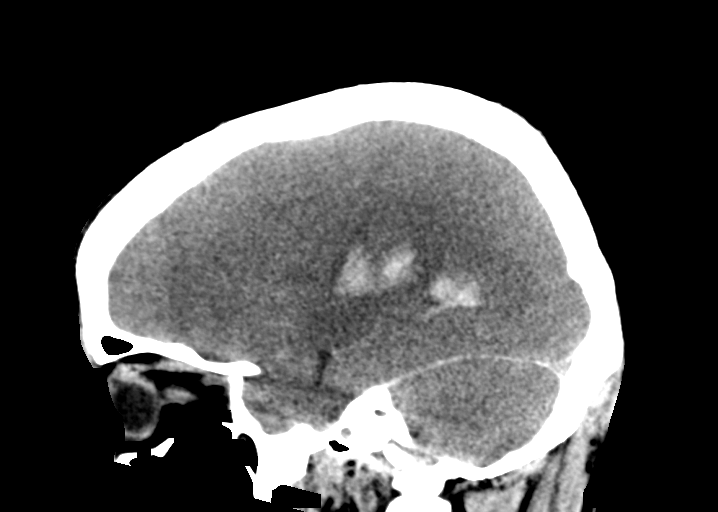

[16 of 47 positions shown; findings below may reference images not displayed]

FINDINGS: Brain: There is new diffuse cerebral edema with diffuse effacement
of sulci, effacement of basilar cisterns, and crowding at the
foramen magnum. Pseudo subarachnoid sign. Stable parenchymal
hemorrhage centered in the right thalamus measuring up to 4.2 cm.
Intraventricular clot mainly seen in the right lateral ventricle.
There has been interval placement of the left frontal
ventriculostomy with effacement of ventricles. Due to cerebral edema
fourth ventricle is not visualized. Midline shift at the level of
the parenchymal hemorrhage measures up to 1 cm.

Vascular: Atherosclerotic calcification.

Skull:  No acute finding.

Sinuses/Orbits: Good opacification of the left maxillary sinus.
Sclerotic wall thickening.

Other: Critical Value/emergent results discussed in person on
04/30/2017 at [DATE] to Dr. FERIENHAUS ERXLEBEN , who was already aware
of the findings.
IMPRESSION: 1. Interval development of diffuse cerebral edema with elevated
intracranial pressure and central downward herniation. Foramen
magnum stenosis.
2. New left frontal ventriculostomy with decompressed ventricles.
3. Stable right thalamic and intraventricular hemorrhage.
